# Patient Record
Sex: Male | Born: 1935 | Race: White | Hispanic: No | Smoking: Former smoker
Health system: Southern US, Community
[De-identification: ages and names within clinical notes are randomized; demographics above are authoritative.]

## PROBLEM LIST (undated history)

## (undated) DIAGNOSIS — E785 Hyperlipidemia, unspecified: Secondary | ICD-10-CM

## (undated) DIAGNOSIS — E119 Type 2 diabetes mellitus without complications: Secondary | ICD-10-CM

## (undated) DIAGNOSIS — R011 Cardiac murmur, unspecified: Secondary | ICD-10-CM

## (undated) DIAGNOSIS — I10 Essential (primary) hypertension: Secondary | ICD-10-CM

## (undated) DIAGNOSIS — IMO0001 Reserved for inherently not codable concepts without codable children: Secondary | ICD-10-CM

## (undated) DIAGNOSIS — M1990 Unspecified osteoarthritis, unspecified site: Secondary | ICD-10-CM

## (undated) DIAGNOSIS — H409 Unspecified glaucoma: Secondary | ICD-10-CM

## (undated) DIAGNOSIS — I4891 Unspecified atrial fibrillation: Secondary | ICD-10-CM

## (undated) DIAGNOSIS — C801 Malignant (primary) neoplasm, unspecified: Secondary | ICD-10-CM

## (undated) HISTORY — PX: TONSILLECTOMY: SUR1361

## (undated) HISTORY — PX: CHOLECYSTECTOMY: SHX55

## (undated) HISTORY — PX: EYE SURGERY: SHX253

## (undated) HISTORY — PX: CARDIAC CATHETERIZATION: SHX172

---

## 2004-10-15 ENCOUNTER — Ambulatory Visit: Payer: Self-pay | Admitting: Internal Medicine

## 2004-11-04 ENCOUNTER — Ambulatory Visit: Payer: Self-pay | Admitting: Internal Medicine

## 2004-12-05 ENCOUNTER — Ambulatory Visit: Payer: Self-pay | Admitting: Internal Medicine

## 2005-01-05 ENCOUNTER — Ambulatory Visit: Payer: Self-pay | Admitting: Internal Medicine

## 2008-12-05 HISTORY — PX: AORTIC VALVE REPLACEMENT: SHX41

## 2009-01-27 ENCOUNTER — Ambulatory Visit: Payer: Self-pay | Admitting: Family Medicine

## 2009-01-29 ENCOUNTER — Ambulatory Visit: Payer: Self-pay | Admitting: Family Medicine

## 2009-03-04 ENCOUNTER — Encounter: Payer: Self-pay | Admitting: Cardiovascular Disease

## 2009-03-05 ENCOUNTER — Encounter: Payer: Self-pay | Admitting: Cardiovascular Disease

## 2009-04-04 ENCOUNTER — Encounter: Payer: Self-pay | Admitting: Cardiovascular Disease

## 2009-12-29 ENCOUNTER — Ambulatory Visit: Payer: Self-pay | Admitting: Unknown Physician Specialty

## 2010-01-05 ENCOUNTER — Ambulatory Visit: Payer: Self-pay | Admitting: Unknown Physician Specialty

## 2010-11-11 ENCOUNTER — Ambulatory Visit: Payer: Self-pay | Admitting: Ophthalmology

## 2010-11-15 ENCOUNTER — Ambulatory Visit: Payer: Self-pay | Admitting: Ophthalmology

## 2011-11-13 ENCOUNTER — Inpatient Hospital Stay: Payer: Self-pay | Admitting: Internal Medicine

## 2012-10-04 ENCOUNTER — Ambulatory Visit: Payer: Self-pay | Admitting: Ophthalmology

## 2012-10-04 LAB — POTASSIUM: Potassium: 4.3 mmol/L (ref 3.5–5.1)

## 2012-10-15 ENCOUNTER — Ambulatory Visit: Payer: Self-pay | Admitting: Ophthalmology

## 2013-03-28 ENCOUNTER — Ambulatory Visit: Payer: Self-pay | Admitting: Cardiology

## 2015-03-24 NOTE — Op Note (Signed)
PATIENT NAME:  Steven Phelps, Steven Phelps MR#:  [id number] DATE OF BIRTH:  03-05-1931  DATE OF PROCEDURE:  10/15/2012  PREOPERATIVE DIAGNOSIS: Cataract, right eye.   POSTOPERATIVE DIAGNOSIS: Cataract, right eye.   PROCEDURE PERFORMED: Extracapsular cataract extraction using phacoemulsification with placement of an Alcon [id number], 22-diopter posterior chamber lens, serial # [id number].   SURGEON: Loura Back. [place] Sloop, M.D.   ANESTHESIA: 4% lidocaine and 0.75% Marcaine in a 50-50 mixture with 10 units/mL of Hylenex added, given as a peribulbar.   ANESTHESIOLOGIST: Dr. Benjamine Mola   COMPLICATIONS: None.   ESTIMATED BLOOD LOSS: Less than 1 mL.   DESCRIPTION OF PROCEDURE:  The patient was brought to the operating room and given a peribulbar block.  The patient was then prepped and draped in the usual fashion.  The vertical rectus muscles were imbricated using 5-0 silk sutures.  These sutures were then clamped to the sterile drapes as bridle sutures.  A limbal peritomy was performed extending two clock hours and hemostasis was obtained with cautery.  A partial thickness scleral groove was made at the surgical limbus and then dissected anteriorly in a lamellar dissection with using an Alcon crescent knife.  The anterior chamber was entered superonasally with a Superblade and through the lamellar dissection with a 2.6-mm keratome.  DisCoVisc was used to replace the aqueous and a continuous tear capsulorrhexis was carried out.  Hydrodissection and hydrodelineation were carried out with balanced salt and a 27 gauge canula.  The nucleus was rotated to confirm the effectiveness of the hydrodissection.  Phacoemulsification was carried out using a divide-and-conquer technique.  Total ultrasound time was 2 minutes and 15 seconds with an average power of 22.9 percent.  CDE 53.17.  Irrigation/aspiration was used to remove the residual cortex.  DisCoVisc was used to inflate the capsule and the internal wound was enlarged to 3  mm with the crescent knife.  The intraocular lens was inserted into the capsular bag using the Acrysert delivery system.  Irrigation/aspiration was used to remove the residual DisCoVisc.  Miostat was injected into the anterior chamber through the paracentesis track to inflate the anterior chamber and induce miosis.  The wound was checked for leaks and wound leakage was found.  A single 10-0 suture was placed across the incision, tied and the knot was rotated superiorly.  The conjunctiva was closed with cautery and the bridle sutures were removed.  Two drops of 0.3% Vigamox were placed on the eye.  An eye shield was placed on the eye.  The patient was discharged to the recovery room in good condition.   ____________________________ Loura Back Jamilyn Pigeon, MD sad:bjt D: 10/15/2012 13:18:32 ET T: 10/15/2012 13:31:08 ET JOB#: [id number]  cc: Remo Lipps A. Kessler Kopinski, MD, <Dictator> Martie Lee MD ELECTRONICALLY SIGNED 10/29/2012 14:39

## 2015-05-20 ENCOUNTER — Encounter
Admission: RE | Admit: 2015-05-20 | Discharge: 2015-05-20 | Disposition: A | Discharge status: Home / Self Care | Payer: Medicare Other | Source: Ambulatory Visit | Attending: Orthopedic Surgery | Admitting: Orthopedic Surgery

## 2015-05-20 DIAGNOSIS — Z01812 Encounter for preprocedural laboratory examination: Secondary | ICD-10-CM | POA: Insufficient documentation

## 2015-05-20 DIAGNOSIS — Z01810 Encounter for preprocedural cardiovascular examination: Secondary | ICD-10-CM | POA: Insufficient documentation

## 2015-05-20 DIAGNOSIS — Z952 Presence of prosthetic heart valve: Secondary | ICD-10-CM | POA: Insufficient documentation

## 2015-05-20 HISTORY — DX: Malignant (primary) neoplasm, unspecified: C80.1

## 2015-05-20 HISTORY — DX: Type 2 diabetes mellitus without complications: E11.9

## 2015-05-20 HISTORY — DX: Unspecified osteoarthritis, unspecified site: M19.90

## 2015-05-20 HISTORY — DX: Cardiac murmur, unspecified: R01.1

## 2015-05-20 HISTORY — DX: Essential (primary) hypertension: I10

## 2015-05-20 HISTORY — DX: Reserved for inherently not codable concepts without codable children: IMO0001

## 2015-05-20 HISTORY — DX: Unspecified glaucoma: H40.9

## 2015-05-20 LAB — HEMOGLOBIN A1C: HEMOGLOBIN A1C: 7.7 % — AB (ref 4.0–6.0)

## 2015-05-20 LAB — PROTIME-INR
INR: 0.96
PROTHROMBIN TIME: 13 s (ref 11.4–15.0)

## 2015-05-20 LAB — SEDIMENTATION RATE: Sed Rate: 43 mm/hr — ABNORMAL HIGH (ref 0–20)

## 2015-05-20 LAB — BASIC METABOLIC PANEL
ANION GAP: 5 (ref 5–15)
BUN: 23 mg/dL — AB (ref 6–20)
CHLORIDE: 105 mmol/L (ref 101–111)
CO2: 29 mmol/L (ref 22–32)
CREATININE: 1.09 mg/dL (ref 0.61–1.24)
Calcium: 9.6 mg/dL (ref 8.9–10.3)
GFR calc Af Amer: >60 mL/min (ref >60)
GLUCOSE: 163 mg/dL — AB (ref 65–99)
POTASSIUM: 4.5 mmol/L (ref 3.5–5.1)
Sodium: 139 mmol/L (ref 135–145)

## 2015-05-20 LAB — CBC
HCT: 40 % (ref 40.0–52.0)
Hemoglobin: 13.1 g/dL (ref 13.0–18.0)
MCH: 31.5 pg (ref 26.0–34.0)
MCHC: 32.7 g/dL (ref 32.0–36.0)
MCV: 96.3 fL (ref 80.0–100.0)
PLATELETS: 147 10*3/uL — AB (ref 150–440)
RBC: 4.15 MIL/uL — ABNORMAL LOW (ref 4.40–5.90)
RDW: 14.8 % — ABNORMAL HIGH (ref 11.5–14.5)
WBC: 4.9 10*3/uL (ref 3.8–10.6)

## 2015-05-20 LAB — URINALYSIS COMPLETE WITH MICROSCOPIC (ARMC ONLY)
Bacteria, UA: NONE SEEN
Bilirubin Urine: NEGATIVE
GLUCOSE, UA: NEGATIVE mg/dL
Hgb urine dipstick: NEGATIVE
Ketones, ur: NEGATIVE mg/dL
LEUKOCYTES UA: NEGATIVE
NITRITE: NEGATIVE
Protein, ur: NEGATIVE mg/dL
RBC / HPF: NONE SEEN RBC/hpf (ref 0–5)
Specific Gravity, Urine: 1.011 (ref 1.005–1.030)
Squamous Epithelial / LPF: NONE SEEN
pH: 5 (ref 5.0–8.0)

## 2015-05-20 LAB — SURGICAL PCR SCREEN
MRSA, PCR: NEGATIVE
Staphylococcus aureus: NEGATIVE

## 2015-05-20 LAB — APTT: APTT: 26 s (ref 24–36)

## 2015-05-20 LAB — ABO/RH: ABO/RH(D): A POS

## 2015-05-20 LAB — TYPE AND SCREEN
ABO/RH(D): A POS
ANTIBODY SCREEN: NEGATIVE

## 2015-05-20 NOTE — Patient Instructions (Signed)
  Your procedure is scheduled on: June 03, 2015 (Wednesday) Report to Same Day Surgery. To find out your arrival time please call [phone] between 1PM - 3PM on June 02, 2015 (Tuesday).  Remember: Instructions that are not followed completely may result in serious medical risk, up to and including death, or upon the discretion of your surgeon and anesthesiologist your surgery may need to be rescheduled.    __x__ 1. Do not eat food or drink liquids after midnight. No gum chewing or hard candies.     __x__ 2. No Alcohol for 24 hours before or after surgery.   ____ 3. Bring all medications with you on the day of surgery if instructed.    _x_ 4. Notify your doctor if there is any change in your medical condition     (cold, fever, infections).     Do not wear jewelry, make-up, hairpins, clips or nail polish.  Do not wear lotions, powders, or perfumes. You may wear deodorant.  Do not shave 48 hours prior to surgery. Men may shave face and neck.  Do not bring valuables to the hospital.    [facility] is not responsible for any belongings or valuables.               Contacts, dentures or bridgework may not be worn into surgery.  Leave your suitcase in the car. After surgery it may be brought to your room.  For patients admitted to the hospital, discharge time is determined by your                treatment team.   Patients discharged the day of surgery will not be allowed to drive home.   Please read over the following fact sheets that you were given:   MRSA Information and Surgical Site Infection Prevention   __x_ Take these medicines the morning of surgery with A SIP OF WATER:    1. Losartan  2. Metoprolol  3.   4.  5.  6.  ____ Fleet Enema (as directed)   _x_ Use CHG Soap as directed  ____ Use inhalers on the day of surgery (Proair inhaler)  ____ Stop metformin 2 days prior to surgery    __x__ Take 1/2 of usual insulin dose the night before surgery and none on the  morning of surgery. (take 1/2 of  Lantus insulin on Tuesday night) __x_ Stop Coumadin/Plavix/aspirin on (stop aspirin one week before surgery per Kinnie Scales per patient) __x_ Stop Anti-inflammatories on (one week before surgery)   __x__ Stop supplements until after surgery.  (now)  ____ Bring C-Pap to the hospital.

## 2015-05-22 LAB — URINE CULTURE: Culture: NO GROWTH

## 2015-06-03 ENCOUNTER — Encounter: Payer: Self-pay | Admitting: Orthopedic Surgery

## 2015-06-03 ENCOUNTER — Inpatient Hospital Stay: Payer: Medicare Other | Admitting: Anesthesiology

## 2015-06-03 ENCOUNTER — Inpatient Hospital Stay
Admission: RE | Admit: 2015-06-03 | Discharge: 2015-06-06 | DRG: 470 | Disposition: A | Discharge status: Skilled Nursing Facility | Payer: Medicare Other | Source: Ambulatory Visit | Attending: Orthopedic Surgery | Admitting: Orthopedic Surgery

## 2015-06-03 ENCOUNTER — Inpatient Hospital Stay: Payer: Medicare Other

## 2015-06-03 ENCOUNTER — Encounter
Admission: RE | Disposition: A | Discharge status: Skilled Nursing Facility | Payer: Self-pay | Source: Ambulatory Visit | Attending: Orthopedic Surgery

## 2015-06-03 DIAGNOSIS — M1712 Unilateral primary osteoarthritis, left knee: Secondary | ICD-10-CM | POA: Diagnosis present

## 2015-06-03 DIAGNOSIS — Z85828 Personal history of other malignant neoplasm of skin: Secondary | ICD-10-CM | POA: Diagnosis not present

## 2015-06-03 DIAGNOSIS — Z96659 Presence of unspecified artificial knee joint: Secondary | ICD-10-CM

## 2015-06-03 DIAGNOSIS — Z794 Long term (current) use of insulin: Secondary | ICD-10-CM | POA: Diagnosis not present

## 2015-06-03 DIAGNOSIS — I10 Essential (primary) hypertension: Secondary | ICD-10-CM | POA: Diagnosis present

## 2015-06-03 DIAGNOSIS — E109 Type 1 diabetes mellitus without complications: Secondary | ICD-10-CM | POA: Diagnosis present

## 2015-06-03 DIAGNOSIS — Z87891 Personal history of nicotine dependence: Secondary | ICD-10-CM | POA: Diagnosis not present

## 2015-06-03 DIAGNOSIS — H409 Unspecified glaucoma: Secondary | ICD-10-CM | POA: Diagnosis present

## 2015-06-03 DIAGNOSIS — Z6834 Body mass index (BMI) 34.0-34.9, adult: Secondary | ICD-10-CM

## 2015-06-03 HISTORY — PX: TOTAL KNEE ARTHROPLASTY: SHX125

## 2015-06-03 LAB — GLUCOSE, CAPILLARY
GLUCOSE-CAPILLARY: 171 mg/dL — AB (ref 65–99)
Glucose-Capillary: 125 mg/dL — ABNORMAL HIGH (ref 65–99)
Glucose-Capillary: 143 mg/dL — ABNORMAL HIGH (ref 65–99)
Glucose-Capillary: 133 mg/dL — ABNORMAL HIGH (ref 65–99)

## 2015-06-03 SURGERY — ARTHROPLASTY, KNEE, TOTAL
Anesthesia: Spinal | Site: Knee | Laterality: Left | Wound class: Clean

## 2015-06-03 MED ORDER — CEFAZOLIN SODIUM-DEXTROSE 2-3 GM-% IV SOLR: 2.0000 g | Freq: Once | INTRAVENOUS | Status: DC

## 2015-06-03 MED ORDER — METRONIDAZOLE 0.75 % EX GEL: 1.0000 "application " | Freq: Every day | CUTANEOUS | Status: DC

## 2015-06-03 MED ORDER — FENTANYL CITRATE (PF) 100 MCG/2ML IJ SOLN: 25.0000 ug | INTRAMUSCULAR | Status: DC | PRN

## 2015-06-03 MED ORDER — BUPIVACAINE HCL (PF) 0.75 % IJ SOLN
INTRAMUSCULAR | Status: DC | PRN
  Administered 2015-06-03: 10 mL via INTRATHECAL

## 2015-06-03 MED ORDER — EPHEDRINE SULFATE 50 MG/ML IJ SOLN
INTRAMUSCULAR | Status: DC | PRN
  Administered 2015-06-03: 10 mg via INTRAVENOUS

## 2015-06-03 MED ORDER — GLIPIZIDE 5 MG PO TABS
10.0000 mg | ORAL_TABLET | Freq: Two times a day (BID) | ORAL | Status: DC
  Administered 2015-06-03 – 2015-06-06 (×6): 10 mg via ORAL
  Filled 2015-06-03 (×6): qty 2

## 2015-06-03 MED ORDER — PANTOPRAZOLE SODIUM 40 MG PO TBEC
40.0000 mg | DELAYED_RELEASE_TABLET | Freq: Two times a day (BID) | ORAL | Status: DC
  Administered 2015-06-03 – 2015-06-06 (×6): 40 mg via ORAL
  Filled 2015-06-03 (×6): qty 1

## 2015-06-03 MED ORDER — SODIUM CHLORIDE 0.9 % IV SOLN
INTRAVENOUS | Status: DC
  Administered 2015-06-03 – 2015-06-04 (×2): via INTRAVENOUS

## 2015-06-03 MED ORDER — BUPIVACAINE-EPINEPHRINE (PF) 0.25% -1:200000 IJ SOLN
INTRAMUSCULAR | Status: AC
  Filled 2015-06-03: qty 30

## 2015-06-03 MED ORDER — MAGNESIUM HYDROXIDE 400 MG/5ML PO SUSP
30.0000 mL | Freq: Every day | ORAL | Status: DC | PRN
  Administered 2015-06-05: 30 mL via ORAL
  Filled 2015-06-03 (×2): qty 30

## 2015-06-03 MED ORDER — FERROUS SULFATE 325 (65 FE) MG PO TABS
325.0000 mg | ORAL_TABLET | Freq: Two times a day (BID) | ORAL | Status: DC
  Administered 2015-06-03 – 2015-06-06 (×6): 325 mg via ORAL
  Filled 2015-06-03 (×6): qty 1

## 2015-06-03 MED ORDER — ACETAMINOPHEN 10 MG/ML IV SOLN
INTRAVENOUS | Status: AC
  Filled 2015-06-03: qty 100

## 2015-06-03 MED ORDER — FLUTICASONE PROPIONATE 50 MCG/ACT NA SUSP
1.0000 | Freq: Every day | NASAL | Status: DC
Start: 2015-06-03 — End: 2015-06-06
  Filled 2015-06-03: qty 16

## 2015-06-03 MED ORDER — ADULT MULTIVITAMIN W/MINERALS CH
1.0000 | ORAL_TABLET | Freq: Every day | ORAL | Status: DC
  Administered 2015-06-05 – 2015-06-06 (×2): 1 via ORAL
  Filled 2015-06-03 (×2): qty 1

## 2015-06-03 MED ORDER — NEOMYCIN-POLYMYXIN B GU 40-200000 IR SOLN
Status: DC | PRN
  Administered 2015-06-03: 14 mL

## 2015-06-03 MED ORDER — CEFAZOLIN SODIUM-DEXTROSE 2-3 GM-% IV SOLR
INTRAVENOUS | Status: DC | PRN
  Administered 2015-06-03: 2 g via INTRAVENOUS

## 2015-06-03 MED ORDER — SODIUM CHLORIDE 0.9 % IV SOLN
INTRAVENOUS | Status: DC
  Administered 2015-06-03 (×3): via INTRAVENOUS

## 2015-06-03 MED ORDER — INSULIN GLARGINE 100 UNIT/ML ~~LOC~~ SOLN
25.0000 [IU] | Freq: Every day | SUBCUTANEOUS | Status: DC
  Administered 2015-06-03 – 2015-06-05 (×3): 25 [IU] via SUBCUTANEOUS
  Filled 2015-06-03 (×4): qty 0.25

## 2015-06-03 MED ORDER — INSULIN ASPART 100 UNIT/ML ~~LOC~~ SOLN
0.0000 [IU] | Freq: Three times a day (TID) | SUBCUTANEOUS | Status: DC
  Administered 2015-06-03 – 2015-06-05 (×4): 2 [IU] via SUBCUTANEOUS
  Administered 2015-06-05: 3 [IU] via SUBCUTANEOUS
  Administered 2015-06-06: 2 [IU] via SUBCUTANEOUS
  Filled 2015-06-03 (×2): qty 2
  Filled 2015-06-03: qty 3
  Filled 2015-06-03 (×3): qty 2

## 2015-06-03 MED ORDER — ENOXAPARIN SODIUM 30 MG/0.3ML ~~LOC~~ SOLN
30.0000 mg | Freq: Two times a day (BID) | SUBCUTANEOUS | Status: DC
  Administered 2015-06-04 – 2015-06-06 (×5): 30 mg via SUBCUTANEOUS
  Filled 2015-06-03 (×5): qty 0.3

## 2015-06-03 MED ORDER — ACETAMINOPHEN 10 MG/ML IV SOLN
INTRAVENOUS | Status: DC | PRN
  Administered 2015-06-03: 1000 mg via INTRAVENOUS

## 2015-06-03 MED ORDER — AMLODIPINE BESYLATE 5 MG PO TABS
5.0000 mg | ORAL_TABLET | Freq: Every day | ORAL | Status: DC
  Administered 2015-06-03 – 2015-06-05 (×3): 5 mg via ORAL
  Filled 2015-06-03 (×3): qty 1

## 2015-06-03 MED ORDER — SENNOSIDES-DOCUSATE SODIUM 8.6-50 MG PO TABS
1.0000 | ORAL_TABLET | Freq: Two times a day (BID) | ORAL | Status: DC
  Administered 2015-06-03 – 2015-06-06 (×6): 1 via ORAL
  Filled 2015-06-03 (×6): qty 1

## 2015-06-03 MED ORDER — ONDANSETRON HCL 4 MG/2ML IJ SOLN: 4.0000 mg | Freq: Four times a day (QID) | INTRAMUSCULAR | Status: DC | PRN

## 2015-06-03 MED ORDER — ACETAMINOPHEN 10 MG/ML IV SOLN
1000.0000 mg | Freq: Four times a day (QID) | INTRAVENOUS | Status: AC
  Administered 2015-06-03 – 2015-06-04 (×3): 1000 mg via INTRAVENOUS
  Filled 2015-06-03 (×4): qty 100

## 2015-06-03 MED ORDER — TETRACAINE HCL 1 % IJ SOLN
INTRAMUSCULAR | Status: DC | PRN
  Administered 2015-06-03: 5 mg via INTRASPINAL

## 2015-06-03 MED ORDER — CEFAZOLIN SODIUM-DEXTROSE 2-3 GM-% IV SOLR
INTRAVENOUS | Status: AC
  Filled 2015-06-03: qty 50

## 2015-06-03 MED ORDER — ONDANSETRON HCL 4 MG/2ML IJ SOLN: 4.0000 mg | Freq: Once | INTRAMUSCULAR | Status: DC | PRN

## 2015-06-03 MED ORDER — SODIUM CHLORIDE 0.9 % IV SOLN: Freq: Once | INTRAVENOUS | Status: DC

## 2015-06-03 MED ORDER — FLEET ENEMA 7-19 GM/118ML RE ENEM: 1.0000 | ENEMA | Freq: Once | RECTAL | Status: AC | PRN

## 2015-06-03 MED ORDER — ALBUTEROL SULFATE HFA 108 (90 BASE) MCG/ACT IN AERS: 2.0000 | INHALATION_SPRAY | Freq: Four times a day (QID) | RESPIRATORY_TRACT | Status: DC | PRN

## 2015-06-03 MED ORDER — VITAMIN C 500 MG PO TABS
500.0000 mg | ORAL_TABLET | Freq: Every day | ORAL | Status: DC
  Administered 2015-06-05 – 2015-06-06 (×2): 500 mg via ORAL
  Filled 2015-06-03 (×2): qty 1

## 2015-06-03 MED ORDER — PROPOFOL INFUSION 10 MG/ML OPTIME
INTRAVENOUS | Status: DC | PRN
  Administered 2015-06-03: 75 ug/kg/min via INTRAVENOUS

## 2015-06-03 MED ORDER — NEOMYCIN-POLYMYXIN B GU 40-200000 IR SOLN
Status: AC
  Filled 2015-06-03: qty 20

## 2015-06-03 MED ORDER — METOPROLOL TARTRATE 100 MG PO TABS
100.0000 mg | ORAL_TABLET | Freq: Two times a day (BID) | ORAL | Status: DC
  Administered 2015-06-03 – 2015-06-06 (×4): 100 mg via ORAL
  Filled 2015-06-03 (×6): qty 1

## 2015-06-03 MED ORDER — MIDAZOLAM HCL 5 MG/5ML IJ SOLN
INTRAMUSCULAR | Status: DC | PRN
  Administered 2015-06-03: 2 mg via INTRAVENOUS

## 2015-06-03 MED ORDER — TRAMADOL HCL 50 MG PO TABS
50.0000 mg | ORAL_TABLET | ORAL | Status: DC | PRN
  Administered 2015-06-03 – 2015-06-06 (×6): 50 mg via ORAL
  Filled 2015-06-03 (×6): qty 1

## 2015-06-03 MED ORDER — ACETAMINOPHEN 325 MG PO TABS: 650.0000 mg | ORAL_TABLET | Freq: Four times a day (QID) | ORAL | Status: DC | PRN

## 2015-06-03 MED ORDER — LATANOPROST 0.005 % OP SOLN
1.0000 [drp] | Freq: Every day | OPHTHALMIC | Status: DC
  Administered 2015-06-03 – 2015-06-05 (×3): 1 [drp] via OPHTHALMIC
  Filled 2015-06-03: qty 2.5

## 2015-06-03 MED ORDER — SODIUM CHLORIDE 0.9 % IJ SOLN
INTRAMUSCULAR | Status: AC
  Filled 2015-06-03: qty 50

## 2015-06-03 MED ORDER — ONDANSETRON HCL 4 MG PO TABS: 4.0000 mg | ORAL_TABLET | Freq: Four times a day (QID) | ORAL | Status: DC | PRN

## 2015-06-03 MED ORDER — PHENOL 1.4 % MT LIQD: 1.0000 | OROMUCOSAL | Status: DC | PRN

## 2015-06-03 MED ORDER — MENTHOL 3 MG MT LOZG
1.0000 | LOZENGE | OROMUCOSAL | Status: DC | PRN
Start: 2015-06-03 — End: 2015-06-06

## 2015-06-03 MED ORDER — SODIUM CHLORIDE 0.9 % IV SOLN
Freq: Once | INTRAVENOUS | Status: AC
  Administered 2015-06-03: 15:00:00 via INTRAVENOUS

## 2015-06-03 MED ORDER — FOLIC ACID 1 MG PO TABS
1000.0000 ug | ORAL_TABLET | Freq: Every day | ORAL | Status: DC
  Administered 2015-06-05 – 2015-06-06 (×2): 1 mg via ORAL
  Filled 2015-06-03 (×2): qty 1

## 2015-06-03 MED ORDER — MORPHINE SULFATE 2 MG/ML IJ SOLN: 2.0000 mg | INTRAMUSCULAR | Status: DC | PRN

## 2015-06-03 MED ORDER — ONDANSETRON HCL 4 MG/2ML IJ SOLN: 4.0000 mg | Freq: Once | INTRAMUSCULAR | Status: AC | PRN

## 2015-06-03 MED ORDER — OXYCODONE HCL 5 MG PO TABS
5.0000 mg | ORAL_TABLET | ORAL | Status: DC | PRN
  Administered 2015-06-03 – 2015-06-05 (×5): 5 mg via ORAL
  Administered 2015-06-05 (×2): 10 mg via ORAL
  Filled 2015-06-03 (×5): qty 1
  Filled 2015-06-03 (×2): qty 2

## 2015-06-03 MED ORDER — ALUM & MAG HYDROXIDE-SIMETH 200-200-20 MG/5ML PO SUSP: 30.0000 mL | ORAL | Status: DC | PRN

## 2015-06-03 MED ORDER — ALBUTEROL SULFATE (2.5 MG/3ML) 0.083% IN NEBU
2.5000 mg | INHALATION_SOLUTION | Freq: Four times a day (QID) | RESPIRATORY_TRACT | Status: DC | PRN
  Administered 2015-06-05 (×2): 2.5 mg via RESPIRATORY_TRACT
  Filled 2015-06-03 (×3): qty 3

## 2015-06-03 MED ORDER — ACETAMINOPHEN 650 MG RE SUPP: 650.0000 mg | Freq: Four times a day (QID) | RECTAL | Status: DC | PRN

## 2015-06-03 MED ORDER — VITAMIN E 180 MG (400 UNIT) PO CAPS
400.0000 [IU] | ORAL_CAPSULE | Freq: Every day | ORAL | Status: DC
  Administered 2015-06-05 – 2015-06-06 (×2): 400 [IU] via ORAL
  Filled 2015-06-03 (×2): qty 1

## 2015-06-03 MED ORDER — SODIUM CHLORIDE 0.9 % IV SOLN
10000.0000 ug | INTRAVENOUS | Status: DC | PRN
  Administered 2015-06-03 (×2): 50 ug via INTRAVENOUS

## 2015-06-03 MED ORDER — SODIUM CHLORIDE 0.9 % IV SOLN
INTRAVENOUS | Status: DC | PRN
  Administered 2015-06-03: 60 mL

## 2015-06-03 MED ORDER — METOCLOPRAMIDE HCL 10 MG PO TABS
10.0000 mg | ORAL_TABLET | Freq: Three times a day (TID) | ORAL | Status: AC
  Administered 2015-06-03 – 2015-06-05 (×8): 10 mg via ORAL
  Filled 2015-06-03 (×8): qty 1

## 2015-06-03 MED ORDER — ATORVASTATIN CALCIUM 20 MG PO TABS
40.0000 mg | ORAL_TABLET | Freq: Every day | ORAL | Status: DC
  Administered 2015-06-03 – 2015-06-05 (×3): 40 mg via ORAL
  Filled 2015-06-03 (×3): qty 2

## 2015-06-03 MED ORDER — BUPIVACAINE-EPINEPHRINE 0.25% -1:200000 IJ SOLN
INTRAMUSCULAR | Status: DC | PRN
  Administered 2015-06-03: 30 mL

## 2015-06-03 MED ORDER — BISACODYL 10 MG RE SUPP
10.0000 mg | Freq: Every day | RECTAL | Status: DC | PRN
  Administered 2015-06-06: 10 mg via RECTAL
  Filled 2015-06-03: qty 1

## 2015-06-03 MED ORDER — FUROSEMIDE 20 MG PO TABS
20.0000 mg | ORAL_TABLET | Freq: Every day | ORAL | Status: DC
  Administered 2015-06-04 – 2015-06-06 (×2): 20 mg via ORAL
  Filled 2015-06-03 (×4): qty 1

## 2015-06-03 MED ORDER — BUPIVACAINE LIPOSOME 1.3 % IJ SUSP
INTRAMUSCULAR | Status: AC
  Filled 2015-06-03: qty 20

## 2015-06-03 MED ORDER — LOSARTAN POTASSIUM 50 MG PO TABS
50.0000 mg | ORAL_TABLET | Freq: Two times a day (BID) | ORAL | Status: DC
  Administered 2015-06-03 – 2015-06-06 (×5): 50 mg via ORAL
  Filled 2015-06-03 (×6): qty 1

## 2015-06-03 MED ORDER — ONDANSETRON HCL 4 MG/2ML IJ SOLN
INTRAMUSCULAR | Status: DC | PRN
  Administered 2015-06-03: 4 mg via INTRAVENOUS

## 2015-06-03 MED ORDER — CEFAZOLIN SODIUM-DEXTROSE 2-3 GM-% IV SOLR
2.0000 g | Freq: Four times a day (QID) | INTRAVENOUS | Status: AC
  Administered 2015-06-03 – 2015-06-04 (×4): 2 g via INTRAVENOUS
  Filled 2015-06-03 (×4): qty 50

## 2015-06-03 SURGICAL SUPPLY — 57 items
AUTOTRANSFUS HAS 1/8 (MISCELLANEOUS) ×3
BATTERY INSTRU NAVIGATION (MISCELLANEOUS) ×12 IMPLANT
BLADE SAW 1 (BLADE) ×3 IMPLANT
BLADE SAW 1/2 (BLADE) ×3 IMPLANT
BONE CEMENT GENTAMICIN (Cement) ×6 IMPLANT
CANISTER SUCT 1200ML W/VALVE (MISCELLANEOUS) ×3 IMPLANT
CANISTER SUCT 3000ML (MISCELLANEOUS) ×6 IMPLANT
CAP KNEE TOTAL 3 SIGMA ×3 IMPLANT
CATH TRAY 16F METER LATEX (MISCELLANEOUS) ×3 IMPLANT
CEMENT BONE GENTAMICIN 40 (Cement) ×2 IMPLANT
COOLER POLAR GLACIER W/PUMP (MISCELLANEOUS) ×3 IMPLANT
DRAPE INCISE IOBAN 66X45 STRL (DRAPES) ×3 IMPLANT
DRAPE SHEET LG 3/4 BI-LAMINATE (DRAPES) ×3 IMPLANT
DRSG DERMACEA 8X12 NADH (GAUZE/BANDAGES/DRESSINGS) ×3 IMPLANT
DRSG OPSITE POSTOP 4X14 (GAUZE/BANDAGES/DRESSINGS) ×3 IMPLANT
DURAPREP 26ML APPLICATOR (WOUND CARE) ×3 IMPLANT
ELECT CAUTERY BLADE 6.4 (BLADE) ×3 IMPLANT
EX-PIN ORTHOLOCK NAV 4X150 (PIN) ×6 IMPLANT
GLOVE BIOGEL M STRL SZ7.5 (GLOVE) ×6 IMPLANT
GLOVE INDICATOR 8.0 STRL GRN (GLOVE) ×3 IMPLANT
GLOVE SURG 9.0 ORTHO LTXF (GLOVE) ×3 IMPLANT
GLOVE SURG ORTHO 9.0 STRL STRW (GLOVE) ×3 IMPLANT
GOWN STRL REUS W/ TWL LRG LVL3 (GOWN DISPOSABLE) ×1 IMPLANT
GOWN STRL REUS W/TWL 2XL LVL3 (GOWN DISPOSABLE) ×3 IMPLANT
GOWN STRL REUS W/TWL LRG LVL3 (GOWN DISPOSABLE) ×2
GOWN STRL REUS W/TWL XL LVL4 (GOWN DISPOSABLE) ×3 IMPLANT
HANDPIECE SUCTION TUBG SURGILV (MISCELLANEOUS) ×3 IMPLANT
HOLDER FOLEY CATH W/STRAP (MISCELLANEOUS) ×3 IMPLANT
HOOD PEEL AWAY FACE SHEILD DIS (HOOD) ×6 IMPLANT
KNIFE SCULPS 14X20 (INSTRUMENTS) ×3 IMPLANT
NDL SAFETY 18GX1.5 (NEEDLE) ×3 IMPLANT
NEEDLE SPNL 18GX3.5 QUINCKE PK (NEEDLE) IMPLANT
NEEDLE SPNL 20GX3.5 QUINCKE YW (NEEDLE) ×3 IMPLANT
NS IRRIG 500ML POUR BTL (IV SOLUTION) ×3 IMPLANT
PACK TOTAL KNEE (MISCELLANEOUS) ×3 IMPLANT
PAD GROUND ADULT SPLIT (MISCELLANEOUS) ×3 IMPLANT
PAD WRAPON POLAR KNEE (MISCELLANEOUS) ×1 IMPLANT
PIN FIXATION 1/8DIA X 3INL (PIN) ×3 IMPLANT
SOL .9 NS 3000ML IRR  AL (IV SOLUTION) ×2
SOL .9 NS 3000ML IRR UROMATIC (IV SOLUTION) ×1 IMPLANT
SOL PREP PVP 2OZ (MISCELLANEOUS) ×3
SOLUTION PREP PVP 2OZ (MISCELLANEOUS) ×1 IMPLANT
SPONGE DRAIN TRACH 4X4 STRL 2S (GAUZE/BANDAGES/DRESSINGS) ×3 IMPLANT
STAPLER SKIN PROX 35W (STAPLE) ×3 IMPLANT
STRAP SAFETY BODY (MISCELLANEOUS) ×3 IMPLANT
SUCTION FRAZIER TIP 10 FR DISP (SUCTIONS) ×3 IMPLANT
SUT VIC AB 0 CT1 36 (SUTURE) ×3 IMPLANT
SUT VIC AB 1 CT1 36 (SUTURE) ×6 IMPLANT
SUT VIC AB 2-0 CT2 27 (SUTURE) ×3 IMPLANT
SYR 20CC LL (SYRINGE) ×3 IMPLANT
SYR 30ML LL (SYRINGE) ×3 IMPLANT
SYR 50ML LL SCALE MARK (SYRINGE) ×3 IMPLANT
SYSTEM AUTOTRANSFUS DUAL TROCR (MISCELLANEOUS) ×1 IMPLANT
TOWEL OR 17X26 4PK STRL BLUE (TOWEL DISPOSABLE) ×3 IMPLANT
TOWER CARTRIDGE SMART MIX (DISPOSABLE) ×3 IMPLANT
WATER STERILE IRR 1000ML POUR (IV SOLUTION) IMPLANT
WRAPON POLAR PAD KNEE (MISCELLANEOUS) ×3

## 2015-06-03 NOTE — Transfer of Care (Signed)
Immediate Anesthesia Transfer of Care Note  Patient: Steven Phelps  Procedure(s) Performed: Procedure(s): TOTAL KNEE ARTHROPLASTY (Left)  Patient Location: PACU  Anesthesia Type:Spinal  Level of Consciousness: sedated  Airway & Oxygen Therapy: Patient connected to face mask oxygen  Post-op Assessment: Report given to RN  Post vital signs: Reviewed and stable  Last Vitals:  Filed Vitals:   06/03/15 1040  BP:   Temp: 36.6 C  Resp:     Complications: No apparent anesthesia complications

## 2015-06-03 NOTE — Anesthesia Procedure Notes (Signed)
Spinal Patient location during procedure: OR Start time: 06/03/2015 7:10 AM End time: 06/03/2015 7:15 AM Reason for block: at surgeon's request Staffing Anesthesiologist: Marline Backbone F Preanesthetic Checklist Completed: patient identified, site marked, surgical consent, pre-op evaluation, timeout performed, IV checked, risks and benefits discussed, monitors and equipment checked and at surgeon's request Spinal Block Patient position: sitting Prep: Betadine Patient monitoring: heart rate, continuous pulse ox and blood pressure Approach: right paramedian Location: L3-4 Injection technique: single-shot Needle Needle type: Tuohy  Needle gauge: 25 G Needle length: 9 cm Needle insertion depth: 6 cm Assessment Sensory level: T10

## 2015-06-03 NOTE — Care Management Note (Signed)
Case Management Note  Patient Details  Name: Steven Phelps MRN: [id number] Date of Birth: Feb 08, 1931  Subjective/Objective:                  Met with patient and his son to discuss discharge planning; Patient just received from PACU. Patient states he lives alone but his son has stated that he can stay with him (like before after cardiac surgery) if his insurance will not cover SNF. Patient has arranged a bed at [facility] per patient- pending his insurance authorization. He does NOT have a front-wheeled walker. He does not remember the name of the home health agency he used about 6 years ago. He uses [facility] for Rx [phone]. PT evaluation for tomorrow.  Action/Plan: RNCM will continue to follow. List of home health agencies shared with patient/son.  Expected Discharge Date:                  Expected Discharge Plan:     In-House Referral:  Clinical Social Work  Discharge planning Services  CM Consult  Post Acute Care Choice:    Choice offered to:  Patient, Adult Children  DME Arranged:    DME Agency:     HH Arranged:    Justice Agency:     Status of Service:     Medicare Important Message Given:    Date Medicare IM Given:    Medicare IM give by:    Date Additional Medicare IM Given:    Additional Medicare Important Message give by:     If discussed at [place] of Stay Meetings, dates discussed:    Additional Comments:  Marshell Garfinkel, RN 06/03/2015, 1:41 PM

## 2015-06-03 NOTE — Anesthesia Preprocedure Evaluation (Addendum)
Anesthesia Evaluation  Patient identified by MRN, date of birth, ID band Patient awake    Reviewed: Allergy & Precautions, NPO status , Patient's Chart, lab work & pertinent test results  History of Anesthesia Complications Negative for: history of anesthetic complications  Airway Mallampati: III       Dental  (+) Teeth Intact   Pulmonary former smoker,    + decreased breath sounds      Cardiovascular hypertension, Pt. on medications IIRhythm:Regular Rate:Normal     Neuro/Psych negative neurological ROS  negative psych ROS   GI/Hepatic negative GI ROS, Neg liver ROS,   Endo/Other  diabetes, Well Controlled, Type 1, Insulin Dependent  Renal/GU negative Renal ROS  negative genitourinary   Musculoskeletal  (+) Arthritis -, Osteoarthritis,    Abdominal (+) + obese,   Peds negative pediatric ROS (+)  Hematology negative hematology ROS (+)   Anesthesia Other Findings   Reproductive/Obstetrics negative OB ROS                            Anesthesia Physical Anesthesia Plan  ASA: III  Anesthesia Plan: Spinal   Post-op Pain Management: MAC Combined w/ Regional for Post-op pain   Induction:   Airway Management Planned: Natural Airway  Additional Equipment:   Intra-op Plan:   Post-operative Plan:   Informed Consent: I have reviewed the patients History and Physical, chart, labs and discussed the procedure including the risks, benefits and alternatives for the proposed anesthesia with the patient or authorized representative who has indicated his/her understanding and acceptance.     Plan Discussed with: CRNA  Anesthesia Plan Comments:         Anesthesia Quick Evaluation

## 2015-06-03 NOTE — Brief Op Note (Signed)
06/03/2015  10:42 AM  PATIENT:  Steven Phelps  81 y.o. male  PRE-OPERATIVE DIAGNOSIS:  DEGENERATIVE OSTEOARTHRITIS left knee  POST-OPERATIVE DIAGNOSIS:  DEGENERATIVE OSTEOARTHRITIS left knee  PROCEDURE:  Procedure(s): TOTAL KNEE ARTHROPLASTY (Left) using computer-assisted navigation  SURGEON:  Surgeon(s) and Role:    * Dereck Leep, MD - Primary  ASSISTANTS: Vance Peper, PA   ANESTHESIA:   spinal  EBL:  Total I/O In: 1500 [I.V.:1500] Out: 200 [Urine:125; Blood:75]  BLOOD ADMINISTERED:none  DRAINS: 2 drains to reinfusion system   LOCAL MEDICATIONS USED:  MARCAINE    and OTHER Exparel  SPECIMEN:  No Specimen  DISPOSITION OF SPECIMEN:  N/A  COUNTS:  YES  TOURNIQUET:   87 min  DICTATION: .Dragon Dictation  PLAN OF CARE: Admit to inpatient   PATIENT DISPOSITION:  PACU - hemodynamically stable.   Delay start of Pharmacological VTE agent (>24hrs) due to surgical blood loss or risk of bleeding: yes

## 2015-06-03 NOTE — Progress Notes (Signed)
POD 0. LTKR with Dr. Marry Guan. Teds, foot pumps, & polar care in place. Gave 5mg  oxycodone at 1313. Patient states 0/10 pain level at this time. Second autovac in place. VSS. Continue to assess.

## 2015-06-03 NOTE — H&P (Signed)
The patient has been re-examined, and the chart reviewed, and there have been no interval changes to the documented history and physical.    The risks, benefits, and alternatives have been discussed at length, and the patient is willing to proceed.   

## 2015-06-03 NOTE — Op Note (Signed)
DATE OF SURGERY:  06/03/2015  PATIENT NAME:  CHAZ MCGLASSON   DOB: 05/16/1931  MRN: [id number]  PRE-OPERATIVE DIAGNOSIS: Degenerative arthrosis of the left knee, primary  POST-OPERATIVE DIAGNOSIS:  Same  PROCEDURE:  Left total knee arthroplasty using computer-assisted navigation  SURGEON:  Marciano Sequin. M.D.  ASSISTANT:  Vance Peper, PA (present and scrubbed throughout the case, critical for assistance with exposure, retraction, instrumentation, and closure)  ANESTHESIA: spinal  ESTIMATED BLOOD LOSS: 75 mL  FLUIDS REPLACED: 1500 mL of crystalloid  TOURNIQUET TIME: 87 minutes  DRAINS: 2 medium drains to a reinfusion system  SOFT TISSUE RELEASES: Anterior cruciate ligament, posterior cruciate ligament, deep medial collateral ligament, patellofemoral ligament   IMPLANTS UTILIZED: DePuy PFC Sigma size 3 posterior stabilized femoral component (cemented), size 4 MBT tibial component (cemented), 41 mm 3 peg oval dome patella (cemented), and a 17.5 mm stabilized rotating platform polyethylene insert.  INDICATIONS FOR SURGERY: HUMZA TALLERICO is a 81 y.o. year old male with a long history of progressive knee pain. X-rays demonstrated severe degenerative changes in tricompartmental fashion. He had not seen any significant improvement despite conservative nonsurgical intervention. After discussion of the risks and benefits of surgical intervention, the patient expressed understanding of the risks benefits and agree with plans for total knee arthroplasty.   The risks, benefits, and alternatives were discussed at length including but not limited to the risks of infection, bleeding, nerve injury, stiffness, blood clots, the need for revision surgery, cardiopulmonary complications, among others, and they were willing to proceed.  PROCEDURE IN DETAIL: The patient was brought into the operating room and, after adequate spinal anesthesia was achieved, a tourniquet was placed on the patient's upper  thigh. The patient's knee and leg were cleaned and prepped with alcohol and DuraPrep and draped in the usual sterile fashion. A "timeout" was performed as per usual protocol. The lower extremity was exsanguinated using an Esmarch, and the tourniquet was inflated to 300 mmHg. An anterior longitudinal incision was made followed by a standard mid vastus approach. The deep fibers of the medial collateral ligament were elevated in a subperiosteal fashion off of the medial flare of the tibia so as to maintain a continuous soft tissue sleeve. The patella was subluxed laterally and the patellofemoral ligament was incised. Inspection of the knee demonstrated severe degenerative changes with full-thickness loss of articular cartilage. Osteophytes were debrided using a rongeur. Anterior and posterior cruciate ligaments were excised. Two 4.0 mm Schanz pins were inserted in the femur and into the tibia for attachment of the array of trackers used for computer-assisted navigation. Hip center was identified using a circumduction technique. Distal landmarks were mapped using the computer. The distal femur and proximal tibia were mapped using the computer. The distal femoral cutting guide was positioned using computer-assisted navigation so as to achieve a 5 distal valgus cut. The femur was sized and it was felt that a size 3 femoral component was appropriate. A size 3 femoral cutting guide was positioned and the anterior cut was performed and verified using the computer. This was followed by completion of the posterior and chamfer cuts. Femoral cutting guide for the central box was then positioned in the center box cut was performed.  Attention was then directed to the proximal tibia. Medial and lateral menisci were excised. The extramedullary tibial cutting guide was positioned using computer-assisted navigation so as to achieve a 0 varus-valgus alignment and 0 posterior slope. The cut was performed and verified using the  computer. The  proximal tibia was sized and it was felt that a size 4 tibial tray was appropriate. Tibial and femoral trials were inserted followed by insertion of a 10 and subsequently a 17.5 mm polyethylene insert. This allowed for excellent mediolateral soft tissue balancing both in flexion and in full extension. Finally, the patella was cut and prepared so as to accommodate a 41 mm 3 peg oval dome patella. A patella trial was placed and the knee was placed through a range of motion with excellent patellar tracking appreciated. The femoral trial was removed after debridement of posterior osteophytes. The central post-hole for the tibial component was reamed followed by insertion of a keel punch. Tibial trials were then removed. Cut surfaces of bone were irrigated with copious amounts of normal saline with antibiotic solution using pulsatile lavage and then suctioned dry. Polymethylmethacrylate cement with gentamicin was prepared in the usual fashion using a vacuum mixer. Cement was applied to the cut surface of the proximal tibia as well as along the undersurface of a size 4 MBT tibial component. Tibial component was positioned and impacted into place. Excess cement was removed using [employer] fast streamer. Cement was then applied to the cut surfaces of the femur as well as along the posterior flanges of the size 3 femoral component. The femoral component was positioned and impacted into place. Excess cement was removed using [employer] fast streamer. A 17.5 mm polyethylene trial was inserted and the knee was brought into full extension with steady axial compression applied. Finally, cement was applied to the backside of a 41 mm 3 peg oval dome patella and the patellar component was positioned and patellar clamp applied. Excess cement was removed using [employer] fast streamer. After adequate curing of the cement, the tourniquet was deflated after a total tourniquet time of 87 minutes. Hemostasis was achieved using electrocautery. The  knee was irrigated with copious amounts of normal saline with antibiotic solution using pulsatile lavage and then suctioned dry. 20 mL of 1.3% Exparel in 40 mL of normal saline was injected along the posterior capsule, medial and lateral gutters, and along the arthrotomy site. A 17.5 mm stabilized rotating platform polyethylene insert was inserted and the knee was placed through a range of motion with excellent mediolateral soft tissue balancing appreciated and excellent patellar tracking noted. 2 medium drains were placed in the wound bed and brought out through separate stab incisions to be attached to a reinfusion system. The medial parapatellar portion of the incision was reapproximated using interrupted sutures of #1 Vicryl. Subcutaneous tissue was then injected with a total of 30 cc of 0.25% Marcaine with epinephrine. Subcutaneous tissue was approximated in layers using first #0 Vicryl followed #2-0 Vicryl. The skin was approximated with skin staples. A sterile dressing was applied.  The patient tolerated the procedure well and was transported to the recovery room in stable condition.    Ziara Thelander P. Holley Bouche., M.D.

## 2015-06-04 LAB — CBC
HEMATOCRIT: 34.1 % — AB (ref 40.0–52.0)
Hemoglobin: 11.5 g/dL — ABNORMAL LOW (ref 13.0–18.0)
MCH: 32.4 pg (ref 26.0–34.0)
MCHC: 33.7 g/dL (ref 32.0–36.0)
MCV: 96.1 fL (ref 80.0–100.0)
PLATELETS: 107 10*3/uL — AB (ref 150–440)
RBC: 3.55 MIL/uL — AB (ref 4.40–5.90)
RDW: 15 % — ABNORMAL HIGH (ref 11.5–14.5)
WBC: 5.3 10*3/uL (ref 3.8–10.6)

## 2015-06-04 LAB — GLUCOSE, CAPILLARY
GLUCOSE-CAPILLARY: 189 mg/dL — AB (ref 65–99)
GLUCOSE-CAPILLARY: 122 mg/dL — AB (ref 65–99)
GLUCOSE-CAPILLARY: 153 mg/dL — AB (ref 65–99)
Glucose-Capillary: 130 mg/dL — ABNORMAL HIGH (ref 65–99)

## 2015-06-04 LAB — BASIC METABOLIC PANEL
Anion gap: 6 (ref 5–15)
BUN: 15 mg/dL (ref 6–20)
CHLORIDE: 107 mmol/L (ref 101–111)
CO2: 28 mmol/L (ref 22–32)
Calcium: 8.5 mg/dL — ABNORMAL LOW (ref 8.9–10.3)
Creatinine, Ser: 1.09 mg/dL (ref 0.61–1.24)
GFR calc Af Amer: >60 mL/min (ref >60)
GFR calc non Af Amer: >60 mL/min (ref >60)
Glucose, Bld: 145 mg/dL — ABNORMAL HIGH (ref 65–99)
Potassium: 4.4 mmol/L (ref 3.5–5.1)
SODIUM: 141 mmol/L (ref 135–145)

## 2015-06-04 MED ORDER — ASPIRIN 81 MG PO TABS: 81.0000 mg | ORAL_TABLET | Freq: Every day | ORAL | Status: AC

## 2015-06-04 MED ORDER — ENOXAPARIN SODIUM 40 MG/0.4ML ~~LOC~~ SOLN: 40.0000 mg | SUBCUTANEOUS | Status: DC

## 2015-06-04 MED ORDER — OXYCODONE HCL 5 MG PO TABS: 5.0000 mg | ORAL_TABLET | ORAL | Status: DC | PRN

## 2015-06-04 MED ORDER — TRAMADOL HCL 50 MG PO TABS: 50.0000 mg | ORAL_TABLET | ORAL | Status: DC | PRN

## 2015-06-04 NOTE — Anesthesia Postprocedure Evaluation (Signed)
  Anesthesia Post-op Note  Patient: Steven Phelps  Procedure(s) Performed: Procedure(s): TOTAL KNEE ARTHROPLASTY (Left)  Anesthesia type:Spinal  Patient location: PACU  Post pain: Pain level controlled  Post assessment: Post-op Vital signs reviewed, Patient's Cardiovascular Status Stable, Respiratory Function Stable, Patent Airway and No signs of Nausea or vomiting  Post vital signs: Reviewed and stable  Last Vitals:  Filed Vitals:   06/04/15 0516  BP: 126/60  Pulse: 80  Temp: 36.9 C  Resp: 16    Level of consciousness: awake, alert  and patient cooperative  Complications: No apparent anesthesia complications

## 2015-06-04 NOTE — Progress Notes (Signed)
Clinical Social Worker (CSW) received SNF consult. PT is recommending home health. RN Case Manager aware of above. Please reconsult if future social work needs arise. CSW signing off.   Genelle Economou Morgan, LCSWA [phone] 

## 2015-06-04 NOTE — Evaluation (Signed)
Physical Therapy Evaluation Patient Details Name: Steven Phelps MRN: [id number] DOB: 1930/12/06 Today's Date: 06/04/2015   History of Present Illness  Pt is an 81 y.o. male s/p L TKA 06/03/15.  Clinical Impression  Currently pt demonstrates impairments with L LE strength, L knee ROM, pain, and limitations with functional mobility.  Prior to admission, pt was independent without AD.  Pt lives alone in 1 level home with 1/2 step to enter (no railing).  Currently pt is supervision supine to sit; CGA to min assist sit to/from stand; and CGA to occasional min assist to steady ambulating 25 feet with RW.  Pt would benefit from skilled PT to address above noted impairments and functional limitations.  Recommend pt discharge to home with HHPT and use of RW when medically appropriate.  Pt voicing concerns regarding being able to function at home without being able to drive for 2 weeks post surgery and feels he should go to STR for more therapy; care management notified.     Follow Up Recommendations Home health PT    Equipment Recommendations  Rolling walker with 5" wheels    Recommendations for Other Services       Precautions / Restrictions Precautions Precautions: Fall Precaution Booklet Issued: Yes (comment) Restrictions Weight Bearing Restrictions: Yes LLE Weight Bearing: Weight bearing as tolerated      Mobility  Bed Mobility Overal bed mobility: Needs Assistance Bed Mobility: Supine to Sit     Supine to sit: Supervision;HOB elevated        Transfers Overall transfer level: Needs assistance Equipment used: Rolling walker (2 wheeled) Transfers: Sit to/from [facility] Sit to Stand: Min guard;Min assist Stand pivot transfers: Min guard       General transfer comment: vc's required for hand and feet placement  Ambulation/Gait Ambulation/Gait assistance: Min guard;Min assist Ambulation Distance (Feet): 25 Feet Assistive device: Rolling walker (2  wheeled)       General Gait Details: mild antalgic gait; mild decreased stance time L LE; mild decreased cadence; mostly steady  Science writer    Modified Rankin (Stroke Patients Only)       Balance Overall balance assessment: Needs assistance Sitting-balance support: No upper extremity supported Sitting balance-Leahy Scale: Normal     Standing balance support: Bilateral upper extremity supported Standing balance-Leahy Scale: Good                               Pertinent Vitals/Pain Pain Assessment: 0-10 Pain Score: 6  Pain Location: L knee pain Pain Descriptors / Indicators: Aching;Sore;Tender Pain Intervention(s): Limited activity within patient's tolerance;Monitored during session;Premedicated before session;Repositioned;Ice applied  During session, pt's HR 86-89 bpm and O2 >94% on room air.    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  Pt agreeable to PT session.    Exercises Total Joint Exercises Goniometric ROM: L knee extension 10 degrees short of neutral semi-supine; L knee flexion 77 degrees in sitting.  Treatment:  Performed semi-supine B LE therapeutic exercise x 10 reps:  Ankle pumps (AROM B LE's); quad sets x3 second holds (AROM B LE's); SAQ's (AROM R; AROM L); heelslides (AROM R; AAROM L), hip abd/adduction (AROM R; AAROM L), and SLR (AROM R; AROM L).  Pt required vc's and tactile cues for correct technique with exercises.       Assessment/Plan    PT Assessment Patient needs continued PT services  PT Diagnosis Acute pain;Abnormality of gait   PT Problem List Decreased strength;Decreased range of motion;Decreased balance;Decreased mobility;Pain  PT Treatment Interventions DME instruction;Gait training;Stair training;Functional mobility training;Therapeutic activities;Therapeutic exercise;Balance training;Patient/family education   PT Goals (Current goals can be found in the Care Plan section) Acute Rehab PT Goals Patient Stated Goal: To have less pain PT Goal Formulation: With patient Time For Goal Achievement: 06/18/15 Potential to Achieve Goals: Good    Frequency BID   Barriers to discharge        Co-evaluation               End of Session Equipment Utilized During Treatment: Gait belt Activity Tolerance: Patient tolerated treatment well Patient left: in chair;with call bell/phone within reach;with chair alarm set;with SCD's reapplied (B heels elevated via towel rolls)           Time: 0263-7858 PT Time Calculation (min) (ACUTE ONLY): 30 min   Charges:   PT Evaluation $Initial PT Evaluation Tier I: 1 Procedure PT Treatments $Therapeutic Exercise: 8-22 mins   PT G CodesLeitha Bleak 06-14-15, 11:11 AM Leitha Bleak, [place]

## 2015-06-04 NOTE — Evaluation (Signed)
Occupational Therapy Evaluation Patient Details Name: Steven Phelps MRN: [id number] DOB: 1931/04/19 Today's Date: 06/04/2015    History of Present Illness  This patient is an 81 year old male who came to [facility] for a L TKR   Clinical Impression   This patient is an 81 year old male who came to [facility] for a L total knee replacement.  Patient lives in a one story home with 2 steps to enter.  He had been independent with ADL and functional mobility. He now requires min assistance for lower body dressing practicing Donned/doffed socks and pants to knees (drain still in place) and verbal cues.       Follow Up Recommendations       Equipment Recommendations    Possible hip kit but may not need in a few days. Written list of vendors given.   Recommendations for Other Services       Precautions / Restrictions Precautions Precautions: Knee Restrictions Weight Bearing Restrictions: Yes LLE Weight Bearing: Weight bearing as tolerated      Mobility Bed Mobility                  Transfers                      Balance                                            ADL                                         General ADL Comments: Patient had been independent, however, now needs minimal assist and precticed donning doffing pants to knees (drain still in place) using hip kit with verbal cues.     Vision     Perception     Praxis      Pertinent Vitals/Pain       Hand Dominance     Extremity/Trunk Assessment Upper Extremity Assessment Upper Extremity Assessment: Overall WFL for tasks assessed           Communication Communication Communication: No difficulties   Cognition Arousal/Alertness: Awake/alert Behavior During Therapy: WFL for tasks assessed/performed Overall Cognitive Status: Within Functional Limits for tasks assessed                      General Comments       Exercises       Shoulder Instructions      Home Living Family/patient expects to be discharged to:: Private residence Living Arrangements: Alone Available Help at Discharge:  (Family but family works) Type of Home: House Home Access: Stairs to enter Technical brewer of Steps: 2   Home Layout: One level     Bathroom Shower/Tub: Aeronautical engineer: None          Prior Functioning/Environment Level of Independence: Independent        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, contracting Diagnosis: Generalized weakness   OT Problem List: Decreased activity tolerance   OT Treatment/Interventions:      OT Goals(Current goals can be found in the care plan section) Acute Rehab OT Goals Patient  Stated Goal: Get back to independence OT Goal Formulation: With patient Time For Goal Achievement: 06/18/15  OT Frequency:     Barriers to D/C:            Co-evaluation              End of Session Equipment Utilized During Treatment:  (Hip kit)  Activity Tolerance: Patient tolerated treatment well Patient left: in chair;with call bell/phone within reach;with chair alarm set   Time: 0938-1000 OT Time Calculation (min): 22 min Charges:  OT General Charges $OT Visit: 1 Procedure OT Evaluation $Initial OT Evaluation Tier I: 1 Procedure OT Treatments $Self Care/Home Management : 8-22 mins G-Codes:    Myrene Galas, MS/OTR/L  06/04/2015, 10:23 AM

## 2015-06-04 NOTE — Plan of Care (Signed)
Problem: Consults Goal: Diagnosis- Total Joint Replacement Outcome: Completed/Met Date Met:  06/04/15 Primary Total Knee

## 2015-06-04 NOTE — Care Management (Signed)
Patient is doing quite well with PT but still insisting going to SNF at [facility] which he may have to pay out of pocket for. His son is not concerned with patient returning home with him as patient has stayed there before after cardiac surgery. He will need a rolling walker and to pick a home health agency. PT to continue to work with patient. RNCM to follow for Lovenox, home health, and rolling walker/DME. Message left for Dr. Marry Guan related to patient progress; waiting for callback from MD.

## 2015-06-04 NOTE — Progress Notes (Signed)
Patient working well with PT. Walked 160 ft. Pain continues to be well managed with alternating po pain meds. Daughter bedside this morning. Patient currently resting quietly. Continue to assess.

## 2015-06-04 NOTE — Progress Notes (Signed)
Physical Therapy Treatment Patient Details Name: Steven Phelps MRN: [id number] DOB: 30-Apr-1931 Today's Date: 06/04/2015    History of Present Illness Pt is an 81 y.o. male s/p L TKA 06/03/15.    PT Comments    Pt progressing well with physical therapy and ambulated 160 feet with RW CGA this afternoon.  Pt continues to report that he feels like he needs to go to STR upon discharge but appears to be progressing well for POD#1 and anticipate with continued progress, pt will be able to discharge home with HHPT.    Follow Up Recommendations  Home health PT     Equipment Recommendations  Rolling walker with 5" wheels    Recommendations for Other Services       Precautions / Restrictions Precautions Precautions: Fall Restrictions Weight Bearing Restrictions: Yes LLE Weight Bearing: Weight bearing as tolerated    Mobility  Bed Mobility Overal bed mobility: Needs Assistance Bed Mobility: Supine to Sit;Sit to Supine     Supine to sit: Supervision;HOB elevated Sit to supine: Supervision;HOB elevated   General bed mobility comments: pt able to lift L LE on and off the bed without assist  Transfers Overall transfer level: Needs assistance Equipment used: Rolling walker (2 wheeled) Transfers: Sit to/from [facility] Sit to Stand: Min guard Stand pivot transfers: Min guard       General transfer comment: vc's required for hand and feet placement  Ambulation/Gait Ambulation/Gait assistance: Min guard Ambulation Distance (Feet): 160 Feet Assistive device: Rolling walker (2 wheeled)       General Gait Details: mild antalgic gait; mild decreased stance time L LE; L LE externally rotated compared to R LE; steady   Financial trader Rankin (Stroke Patients Only)       Balance                                    Cognition Arousal/Alertness: Awake/alert Behavior During Therapy: WFL for tasks  assessed/performed Overall Cognitive Status: Within Functional Limits for tasks assessed                      Exercises   Performed sitting exercises x 10 reps B LE's:  LAQ's (AROM R; AROM L); marching/hip flexion (AROM R; AROM L); L knee flexion self stretches 3x15 seconds.  Pt required vc's and tactile cues for correct technique with exercises.    General Comments  Nursing cleared pt for participation in PT and pt agreeable to PT session.  Pt reports he was sore sitting up in the chair and requesting back to bed end of session.      Pertinent Vitals/Pain Pain Assessment: 0-10 Pain Score: 6  Pain Location: L knee pain Pain Descriptors / Indicators: Sore Pain Intervention(s): Limited activity within patient's tolerance;Monitored during session;Premedicated before session;Repositioned;Ice applied  Vitals stable and WFL throughout treatment session.     Home Living                      Prior Function            PT Goals (current goals can now be found in the care plan section) Acute Rehab PT Goals Patient Stated Goal: To have less pain PT Goal Formulation: With patient Time For Goal Achievement: 06/18/15 Potential to Achieve Goals: Good Progress  towards PT goals: Progressing toward goals    Frequency  BID    PT Plan Current plan remains appropriate    Co-evaluation             End of Session Equipment Utilized During Treatment: Gait belt Activity Tolerance: Patient tolerated treatment well Patient left: in bed;with call bell/phone within reach;with bed alarm set;with SCD's reapplied (B heels elevated via towel rolls)     Time: 9532-0233 PT Time Calculation (min) (ACUTE ONLY): 27 min  Charges:  $Gait Training: 8-22 mins $Therapeutic Exercise: 8-22 mins                    G CodesLeitha Bleak June 25, 2015, 4:21 PM Leitha Bleak, [place]

## 2015-06-04 NOTE — Progress Notes (Signed)
Per RN Case Manager patient is adamant about going to SNF. Clinical Education officer, museum (CSW) started [employer] authorization and faxed in clinicals. [employer] will probably deny patient SNF as he is walking 160 feet. CSW also started bed search. CSW will continue to follow and assist as needed.   Blima Rich, [place] [phone]

## 2015-06-04 NOTE — Discharge Instructions (Signed)
° °TOTAL KNEE REPLACEMENT POSTOPERATIVE DIRECTIONS ° °Knee Rehabilitation, Guidelines Following Surgery  °Results after knee surgery are often greatly improved when you follow the exercise, range of motion and muscle strengthening exercises prescribed by your doctor. Safety measures are also important to protect the knee from further injury. Any time any of these exercises cause you to have increased pain or swelling in your knee joint, decrease the amount until you are comfortable again and slowly increase them. If you have problems or questions, call your caregiver or physical therapist for advice.  ° °HOME CARE INSTRUCTIONS  °Remove items at home which could result in a fall. This includes throw rugs or furniture in walking pathways.  °· Continue to use polar care unit on the knee for pain and swelling from surgery. You may notice swelling that will progress down to the foot and ankle.  This is normal after surgery.  Elevate the leg when you are not up walking on it.   °· Continue to use the breathing machine which will help keep your temperature down.  It is common for your temperature to cycle up and down following surgery, especially at night when you are not up moving around and exerting yourself.  The breathing machine keeps your lungs expanded and your temperature down. °· Do not place pillow under knee, focus on keeping the knee STRAIGHT while resting ° °DIET °You may resume your previous home diet once your are discharged from the hospital. ° °DRESSING / WOUND CARE / SHOWERING °You may start showering once staples have been removed. Change the surgical dressing as needed. ° °ACTIVITY °Walk with your walker as instructed. °Use walker as long as suggested by your caregivers. °Avoid periods of inactivity such as sitting longer than an hour when not asleep. This helps prevent blood clots.  °You may resume a sexual relationship in one month or when given the OK by your doctor.  °You may return to work once  you are cleared by your doctor.  °Do not drive a car for 6 weeks or until released by you surgeon.  °Do not drive while taking narcotics. ° °WEIGHT BEARING °Weight bearing as tolerated with assist device (walker, cane, etc) as directed, use it as long as suggested by your surgeon or therapist, typically at least 4-6 weeks. ° °POSTOPERATIVE CONSTIPATION PROTOCOL °Constipation - defined medically as fewer than three stools per week and severe constipation as less than one stool per week. ° °One of the most common issues patients have following surgery is constipation.  Even if you have a regular bowel pattern at home, your normal regimen is likely to be disrupted due to multiple reasons following surgery.  Combination of anesthesia, postoperative narcotics, change in appetite and fluid intake all can affect your bowels.  In order to avoid complications following surgery, here are some recommendations in order to help you during your recovery period. ° °Colace (docusate) - Pick up an over-the-counter form of Colace or another stool softener and take twice a day as long as you are requiring postoperative pain medications.  Take with a full glass of water daily.  If you experience loose stools or diarrhea, hold the colace until you stool forms back up.  If your symptoms do not get better within 1 week or if they get worse, check with your doctor. ° °Dulcolax (bisacodyl) - Pick up over-the-counter and take as directed by the product packaging as needed to assist with the movement of your bowels.  Take with a   full glass of water.  Use this product as needed if not relieved by Colace only.   MiraLax (polyethylene glycol) - Pick up over-the-counter to have on hand.  MiraLax is a solution that will increase the amount of water in your bowels to assist with bowel movements.  Take as directed and can mix with a glass of water, juice, soda, coffee, or tea.  Take if you go more than two days without a movement. Do not use  MiraLax more than once per day. Call your doctor if you are still constipated or irregular after using this medication for 7 days in a row.  If you continue to have problems with postoperative constipation, please contact the office for further assistance and recommendations.  If you experience "the worst abdominal pain ever" or develop nausea or vomiting, please contact the office immediatly for further recommendations for treatment.  ITCHING  If you experience itching with your medications, try taking only a single pain pill, or even half a pain pill at a time.  You can also use Benadryl over the counter for itching or also to help with sleep.   TED HOSE STOCKINGS Wear the elastic stockings on both legs for  6 weeks following surgery during the day but you may remove then at night for sleeping.  MEDICATIONS See your medication summary on the After Visit Summary that the nursing staff will review with you prior to discharge.  You may have some home medications which will be placed on hold until you complete the course of blood thinner medication.  It is important for you to complete the blood thinner medication as prescribed by your surgeon.  Continue your approved medications as instructed at time of discharge.  PRECAUTIONS If you experience chest pain or shortness of breath - call 911 immediately for transfer to the hospital emergency department.  If you develop a fever greater that 101 F, purulent drainage from wound, increased redness or drainage from wound, foul odor from the wound/dressing, or calf pain - CONTACT YOUR SURGEON.                                                   FOLLOW-UP APPOINTMENTS Make sure you keep all of your appointments after your operation with your surgeon and caregivers. You should call the office at the above phone number and make an appointment for approximately two weeks after the date of your surgery or on the date instructed by your surgeon outlined in the  "After Visit Summary".   RANGE OF MOTION AND STRENGTHENING EXERCISES  Rehabilitation of the knee is important following a knee injury or an operation. After just a few days of immobilization, the muscles of the thigh which control the knee become weakened and shrink (atrophy). Knee exercises are designed to build up the tone and strength of the thigh muscles and to improve knee motion. Often times heat used for twenty to thirty minutes before working out will loosen up your tissues and help with improving the range of motion but do not use heat for the first two weeks following surgery. These exercises can be done on a training (exercise) mat, on the floor, on a table or on a bed. Use what ever works the best and is most comfortable for you Knee exercises include:  Leg Lifts - While your knee is  still immobilized in a splint or cast, you can do straight leg raises. Lift the leg to 60 degrees, hold for 3 sec, and slowly lower the leg. Repeat 10-20 times 2-3 times daily. Perform this exercise against resistance later as your knee gets better.  Quad and Hamstring Sets - Tighten up the muscle on the front of the thigh (Quad) and hold for 5-10 sec. Repeat this 10-20 times hourly. Hamstring sets are done by pushing the foot backward against an object and holding for 5-10 sec. Repeat as with quad sets.   Leg Slides: Lying on your back, slowly slide your foot toward your buttocks, bending your knee up off the floor (only go as far as is comfortable). Then slowly slide your foot back down until your leg is flat on the floor again.  Angel Wings: Lying on your back spread your legs to the side as far apart as you can without causing discomfort.  A rehabilitation program following serious knee injuries can speed recovery and prevent re-injury in the future due to weakened muscles. Contact your doctor or a physical therapist for more information on knee rehabilitation.   IF YOU ARE TRANSFERRED TO A SKILLED REHAB  FACILITY If the patient is transferred to a skilled rehab facility following release from the hospital, a list of the current medications will be sent to the facility for the patient to continue.  When discharged from the skilled rehab facility, please have the facility set up the patient's [place] prior to being released. Also, the skilled facility will be responsible for providing the patient with their medications at time of release from the facility to include their pain medication, the muscle relaxants, and their blood thinner medication. If the patient is still at the rehab facility at time of the two week follow up appointment, the skilled rehab facility will also need to assist the patient in arranging follow up appointment in our office and any transportation needs.  MAKE SURE YOU:  Understand these instructions.  Get help right away if you are not doing well or get worse.

## 2015-06-04 NOTE — Progress Notes (Signed)
   Subjective: 1 Day Post-Op Procedure(s) (LRB): TOTAL KNEE ARTHROPLASTY (Left) Patient reports pain as 3 on 0-10 scale.   Patient is well, and has had no acute complaints or problems We will start therapy today.  Plan is to go Rehab after hospital stay. Patient would like to go to [facility]  no nausea and no vomiting Patient denies any chest pains or shortness of breath. Objective: Vital signs in last 24 hours: Temp:  [96.2 F (35.7 C)-98.4 F (36.9 C)] 98.4 F (36.9 C) (06/30 0516) Pulse Rate:  [69-90] 80 (06/30 0516) Resp:  [16-18] 16 (06/30 0516) BP: (108-131)/(53-69) 126/60 mmHg (06/30 0516) SpO2:  [93 %-98 %] 96 % (06/30 0516) Weight:  [102.513 kg (226 lb)] 102.513 kg (226 lb) (06/29 1200) Patient has original dressing in place. Heels are non tender and elevated off the bed using rolled towels Intake/Output from previous day: 06/29 0701 - 06/30 0700 In: 3740 [P.O.:240; I.V.:2700; Blood:300; IV Piggyback:500] Out: 2625 [Urine:2050; Drains:500; Blood:75] Intake/Output this shift:     Recent Labs  06/04/15 0437  HGB 11.5*    Recent Labs  06/04/15 0437  WBC 5.3  RBC 3.55*  HCT 34.1*  PLT 107*    Recent Labs  06/04/15 0437  NA 141  K 4.4  CL 107  CO2 28  BUN 15  CREATININE 1.09  GLUCOSE 145*  CALCIUM 8.5*   No results for input(s): LABPT, INR in the last 72 hours.  EXAM General - Patient is Alert, Disorganized and Oriented Extremity - Neurologically intact Neurovascular intact Sensation intact distally Intact pulses distally Dorsiflexion/Plantar flexion intact Dressing - dressing C/D/I Motor Function - intact, moving foot and toes well on exam. Good gross motor strength is dorsiflexion and plantarflexion of the foot  Past Medical History  Diagnosis Date  . Hypertension   . Heart murmur   . Shortness of breath dyspnea   . Diabetes mellitus without complication   . Arthritis   . Cancer     skin  . Glaucoma (increased eye pressure)      Assessment/Plan: 1 Day Post-Op Procedure(s) (LRB): TOTAL KNEE ARTHROPLASTY (Left) Active Problems:   Left knee DJD   Total knee replacement status  Estimated body mass index is 34.37 kg/(m^2) as calculated from the following:   Height as of this encounter: 5\' 8"  (1.727 m).   Weight as of this encounter: 102.513 kg (226 lb). Advance diet Up with therapy D/C IV fluids Discharge to SNF on Saturday  Labs: Were reviewed on today's visit DVT Prophylaxis - Lovenox, Foot Pumps and TED hose Weight-Bearing as tolerated to left leg Need to start working on having a bowel movement today D/C O2 and Pulse OX and try on Room [employer] R. Hansford Carson 06/04/2015, 7:43 AM   F2

## 2015-06-05 ENCOUNTER — Encounter: Admission: RE | Admit: 2015-06-05 | Payer: Medicare Other | Source: Ambulatory Visit | Admitting: Internal Medicine

## 2015-06-05 LAB — CBC
HCT: 36.1 % — ABNORMAL LOW (ref 40.0–52.0)
HEMOGLOBIN: 11.9 g/dL — AB (ref 13.0–18.0)
MCH: 31.9 pg (ref 26.0–34.0)
MCHC: 33.1 g/dL (ref 32.0–36.0)
MCV: 96.4 fL (ref 80.0–100.0)
PLATELETS: 123 10*3/uL — AB (ref 150–440)
RBC: 3.75 MIL/uL — AB (ref 4.40–5.90)
RDW: 14.7 % — AB (ref 11.5–14.5)
WBC: 5.7 10*3/uL (ref 3.8–10.6)

## 2015-06-05 LAB — GLUCOSE, CAPILLARY
GLUCOSE-CAPILLARY: 202 mg/dL — AB (ref 65–99)
Glucose-Capillary: 111 mg/dL — ABNORMAL HIGH (ref 65–99)
Glucose-Capillary: 136 mg/dL — ABNORMAL HIGH (ref 65–99)
Glucose-Capillary: 192 mg/dL — ABNORMAL HIGH (ref 65–99)

## 2015-06-05 MED ORDER — PNEUMOCOCCAL VAC POLYVALENT 25 MCG/0.5ML IJ INJ: 0.5000 mL | INJECTION | INTRAMUSCULAR | Status: DC

## 2015-06-05 NOTE — Progress Notes (Addendum)
   Subjective: 2 Days Post-Op Procedure(s) (LRB): TOTAL KNEE ARTHROPLASTY (Left) Patient reports pain as mild.   Patient is well, and has had no acute complaints or problems We'll continue with physical therapy today.  Plan is to go Rehab after hospital stay. no nausea and no vomiting Patient denies any chest pains or shortness of breath. Objective: Vital signs in last 24 hours: Temp:  [98.2 F (36.8 C)-98.9 F (37.2 C)] 98.3 F (36.8 C) (07/01 0542) Pulse Rate:  [77-91] 89 (07/01 0542) Resp:  [18-20] 20 (07/01 0542) BP: (118-145)/(60-73) 132/73 mmHg (07/01 0542) SpO2:  [93 %-96 %] 94 % (07/01 0542) well approximated incision Heels are non tender and elevated off the bed using rolled towels Intake/Output from previous day: 06/30 0701 - 07/01 0700 In: 2652 [P.O.:720; I.V.:1932] Out: 2285 [Urine:1875; Drains:410] Intake/Output this shift: Total I/O In: -  Out: 200 [Urine:200]   Recent Labs  06/04/15 0437 06/05/15 0538  HGB 11.5* 11.9*    Recent Labs  06/04/15 0437 06/05/15 0538  WBC 5.3 5.7  RBC 3.55* 3.75*  HCT 34.1* 36.1*  PLT 107* 123*    Recent Labs  06/04/15 0437  NA 141  K 4.4  CL 107  CO2 28  BUN 15  CREATININE 1.09  GLUCOSE 145*  CALCIUM 8.5*   No results for input(s): LABPT, INR in the last 72 hours.  EXAM General - Patient is Alert, Appropriate and Oriented Extremity - Neurologically intact Neurovascular intact Sensation intact distally Intact pulses distally Dorsiflexion/Plantar flexion intact Dressing - scant drainage Motor Function - intact, moving foot and toes well on exam. Patient unable to move the leg he did yesterday. Feels that this secondary to the therapy. Unable to do straight leg raise on his own. Neurologically he is intact and within normal limits.  Past Medical History  Diagnosis Date  . Hypertension   . Heart murmur   . Shortness of breath dyspnea   . Diabetes mellitus without complication   . Arthritis   .  Cancer     skin  . Glaucoma (increased eye pressure)     Assessment/Plan: 2 Days Post-Op Procedure(s) (LRB): TOTAL KNEE ARTHROPLASTY (Left) Active Problems:   Left knee DJD   Total knee replacement status  Estimated body mass index is 34.37 kg/(m^2) as calculated from the following:   Height as of this encounter: 5\' 8"  (1.727 m).   Weight as of this encounter: 102.513 kg (226 lb). Up with therapy Plan for discharge tomorrow Discharge to SNF  Labs: Were reviewed DVT Prophylaxis - Lovenox, Foot Pumps and TED hose Weight-Bearing as tolerated to left leg Hemovac was discontinued and dressing was changed on today's Patient needs a bowel movement today Recommend short-term rehabilitation at this time. Patient unable to safely go home.  Jillyn Ledger. [place] 06/05/2015, 7:34 AM

## 2015-06-05 NOTE — Progress Notes (Addendum)
Patient tolerated PT well today. Required oxygen placement,  Slightly congested and has wheezing upon exertion. Declined first attempt at [facility] treatment but due to low saturation levels approved second attempt.  Placed on oxygen 2 liters due to desaturation prior to treatment.  Encouraged deep breathing and coughing.  PO pain medication managing pain.  Plan to discharge to [facility] tomorrow.  Patient still needs to have bowel movement.

## 2015-06-05 NOTE — Clinical Social Work Note (Signed)
Clinical Social Work Assessment  Patient Details  Name: Steven Phelps MRN: [id number] Date of Birth: July 13, 1931  Date of referral:  06/05/15               Reason for consult:  Facility Placement                Permission sought to share information with:  Chartered certified accountant granted to share information::  Yes, Verbal Permission Granted  Name::      IT sales professional::   [place]   Relationship::     Contact Information:     Housing/Transportation Living arrangements for the past 2 months:  [place] of Information:  Patient Patient Interpreter Needed:  None Criminal Activity/Legal Involvement Pertinent to Current Situation/Hospitalization:  No - Comment as needed Significant Relationships:  Adult Children Lives with:  Self Do you feel safe going back to the place where you live?  Yes Need for family participation in patient care:  Yes (Comment)  Care giving concerns:  Patient lives alone in [place].    Social Worker assessment / plan: Holiday representative (CSW) met with patient to discuss D/C plan. CSW introduced self and explained role of CSW department. Patient's daughter Steven Phelps [phone]) and son Steven Phelps [phone]) were at bedside. Patient reported that he lives alone in [place]. Patient reported that he wants to go to [facility] for rehab.   [facility] has made a bed offer. Patient accepted bed offer.    Employment status:  Retired Nurse, adult PT Recommendations:  [place] / Referral to community resources:  [place]  Patient/Family's Response to care:  Patient is agreeable rehab at [facility].   Patient/Family's Understanding of and Emotional Response to Diagnosis, Current Treatment, and Prognosis:  Patient was pleasant and sitting up in the bed.Patient thanked CSW for visit and assisting with place,emt process.   Emotional  Assessment Appearance:    Attitude/Demeanor/Rapport:    Affect (typically observed):  Pleasant Orientation:  Oriented to Self, Oriented to Place, Oriented to  Time, Oriented to Situation Alcohol / Substance use:  Not Applicable Psych involvement (Current and /or in the community):  No (Comment)  Discharge Needs  Concerns to be addressed:  Discharge Planning Concerns Readmission within the last 30 days:  No Current discharge risk:  Chronically ill, Lives alone Barriers to Discharge:  Continued Medical Work up   Elwyn Reach 06/05/2015, 3:47 PM

## 2015-06-05 NOTE — Clinical Social Work Placement (Signed)
   CLINICAL SOCIAL WORK PLACEMENT  NOTE  Date:  06/05/2015  Patient Details  Name: Steven Phelps MRN: [id number] Date of Birth: 1931/09/01  Clinical Social Work is seeking post-discharge placement for this patient at the [place] level of care (*CSW will initial, date and re-position this form in  chart as items are completed):  Yes   Patient/family provided with [place] Work Department's list of facilities offering this level of care within the geographic area requested by the patient (or if unable, by the patient's family).  Yes   Patient/family informed of their freedom to choose among providers that offer the needed level of care, that participate in Medicare, Medicaid or managed care program needed by the patient, have an available bed and are willing to accept the patient.  Yes   Patient/family informed of [place]'s ownership interest in [facility] and [facility], as well as of the fact that they are under no obligation to receive care at these facilities.  PASRR submitted to EDS on 06/04/15     PASRR number received on 06/04/15     Existing PASRR number confirmed on       FL2 transmitted to all facilities in geographic area requested by pt/family on 06/05/15     FL2 transmitted to all facilities within larger geographic area on       Patient informed that his/her managed care company has contracts with or will negotiate with certain facilities, including the following:        Yes   Patient/family informed of bed offers received.  Patient chooses bed at  [facility] )     Physician recommends and patient chooses bed at      Patient to be transferred to   on  .  Patient to be transferred to facility by       Patient family notified on   of transfer.  Name of family member notified:        PHYSICIAN       Additional Comment:    _______________________________________________ Loralyn Freshwater, LCSW 06/05/2015,  3:45 PM

## 2015-06-05 NOTE — Progress Notes (Signed)
Physical Therapy Treatment Patient Details Name: Steven Phelps MRN: [id number] DOB: [birth year] Today's Date: 06/05/2015    History of Present Illness Pt is an 81 y.o. male s/p L TKA 06/03/15.    PT Comments    Pt was seen for continued treatment and encouraged him to stretch ext on LLE and to be up in chair to clear lungs.  Tried walk without O2 and noted his sats 97% after walk.  He agreed to sit up in chair for dinner, and had legs extended given his frequent time sitting flexed, esp since he is -12 ext.    Follow Up Recommendations  SNF     Equipment Recommendations  Rolling walker with 5" wheels    Recommendations for Other Services       Precautions / Restrictions Precautions Precautions: Fall;Knee Restrictions Weight Bearing Restrictions: Yes LLE Weight Bearing: Weight bearing as tolerated    Mobility  Bed Mobility Overal bed mobility: Needs Assistance Bed Mobility: Supine to Sit     Supine to sit: Supervision        Transfers Overall transfer level: Needs assistance Equipment used: Rolling walker (2 wheeled) Transfers: Sit to/from [employer] Transfers Sit to Stand: Min assist Stand pivot transfers: Min guard       General transfer comment: reminders for hand placement at transitions  Ambulation/Gait Ambulation/Gait assistance: Min guard Ambulation Distance (Feet): 80 Feet Assistive device: Rolling walker (2 wheeled) Gait Pattern/deviations: Step-to pattern;Step-through pattern;Decreased dorsiflexion - right;Decreased dorsiflexion - left;Decreased stance time - left Gait velocity: reduced Gait velocity interpretation: Below normal speed for age/gender     Stairs            Wheelchair Mobility    Modified Rankin (Stroke Patients Only)       Balance                                    Cognition Arousal/Alertness: Awake/alert Behavior During Therapy: WFL for tasks assessed/performed Overall Cognitive Status:  Within Functional Limits for tasks assessed                      Exercises Total Joint Exercises Ankle Circles/Pumps: AROM;Both;10 reps Quad Sets: AROM;Both;10 reps Gluteal Sets: AROM;Both;10 reps Heel Slides: AROM;AAROM;Both;10 reps Straight Leg Raises: AROM;Left;5 reps Goniometric ROM: L knee ext -12 deg    General Comments General comments (skin integrity, edema, etc.): Pt was able to sit up bedside and manage to stand with bed at regular height. More capable with practice of exercises before starting gait      Pertinent Vitals/Pain Pain Assessment: Faces Pain Score: 4  Faces Pain Scale: Hurts little more Pain Location: L knee Pain Intervention(s): Limited activity within patient's tolerance;Monitored during session;Premedicated before session;Patient requesting pain meds-RN notified;Repositioned;Ice applied    Home Living                      Prior Function            PT Goals (current goals can now be found in the care plan section) Acute Rehab PT Goals Patient Stated Goal: To go to rehab Progress towards PT goals: Progressing toward goals    Frequency  BID    PT Plan      Co-evaluation             End of Session Equipment Utilized During Treatment: Gait belt Activity Tolerance: Patient limited  by pain Patient left: in chair;with call bell/phone within reach     Time: 1559-1630 PT Time Calculation (min) (ACUTE ONLY): 31 min  Charges:  $Gait Training: 8-22 mins $Therapeutic Exercise: 8-22 mins                    G Codes:      Steven Phelps 06/25/2015, 5:00 PM   Mee Hives, PT MS Acute Rehab Dept. Number: [facility] [id number] and [place] [phone]

## 2015-06-05 NOTE — Discharge Summary (Signed)
Physician Discharge Summary  Patient ID: Steven Phelps MRN: [id number] DOB/AGE: 1931/04/22 81 y.o.  Admit date: 06/03/2015 Discharge date: 06/05/2015  Admission Diagnoses:  DEGENERATIVE OSTEOARTHRITIS   Discharge Diagnoses: Patient Active Problem List   Diagnosis Date Noted  . 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 Total knee replacement status 06/03/2015    Past Medical History  Diagnosis Date  . Hypertension   . Heart murmur   . Shortness of breath dyspnea   . Diabetes mellitus without complication   . Arthritis   . Cancer     skin  . Glaucoma (increased eye pressure)      Transfusion: Patient did receive Autovac transfusion the first 6 hours postoperatively   Consultants (if any):   there was no consultations during this admission  Discharged Condition: Improved  Hospital Course: Steven Phelps is an 81 y.o. male who was admitted 06/03/2015 with a diagnosis of degenerative arthrosis left knee and went to the operating room on 06/03/2015 and underwent the above named procedures.    Surgeries:Procedure(s): TOTAL KNEE ARTHROPLASTY on 06/03/2015  ANESTHESIA: spinal  ESTIMATED BLOOD LOSS: 75 mL  FLUIDS REPLACED: 1500 mL of crystalloid  TOURNIQUET TIME: 87 minutes  DRAINS: 2 medium drains to a reinfusion system  SOFT TISSUE RELEASES: Anterior cruciate ligament, posterior cruciate ligament, deep medial collateral ligament, patellofemoral ligament   IMPLANTS UTILIZED: DePuy PFC Sigma size 3 posterior stabilized femoral component (cemented), size 4 MBT tibial component (cemented), 41 mm 3 peg oval dome patella (cemented), and a 17.5 mm stabilized rotating platform polyethylene insert.  INDICATIONS FOR SURGERY: Steven Phelps is a 81 y.o. year old male with a long history of progressive knee pain. X-rays demonstrated severe degenerative changes in tricompartmental fashion. He had not seen any significant improvement despite conservative nonsurgical intervention. After discussion of  the risks and benefits of surgical intervention, the patient expressed understanding of the risks benefits and agree with plans for total knee arthroplasty.   Patient tolerated the surgery well. No complications .Patient was taken to PACU where she was stabilized and then transferred to the orthopedic floor.  Patient started on Lovenox 30 mg  q 12 hrs. Foot pumps applied bilaterally at 80 mmHg. Heels elevated off bed with rolled towels. No evidence of DVT. Calves non tender. Negative Homan. Physical therapy started on day #1 for gait training and transfer with OT starting on  day #1 for ADL and assisted devices. Patient has done well with therapy. Ambulated 100 feet upon being discharged.  Patient's IV , foley was discharged on day #1 and hemovac was d/c on day #2.   He was given perioperative antibiotics:  Anti-infectives    Start     Dose/Rate Route Frequency Ordered Stop   06/03/15 1245  ceFAZolin (ANCEF) IVPB 2 g/50 mL premix     2 g 100 mL/hr over 30 Minutes Intravenous Every 6 hours 06/03/15 1241 06/04/15 0602   06/03/15 0615  ceFAZolin (ANCEF) IVPB 2 g/50 mL premix  Status:  Discontinued     2 g 100 mL/hr over 30 Minutes Intravenous  Once 06/03/15 0600 06/03/15 1152   06/03/15 0604  ceFAZolin (ANCEF) 2-3 GM-% IVPB SOLR    Comments:  Slemenda, Debbie: cabinet override      06/03/15 0604 06/03/15 1814    .  He was given sequential compression devices, early ambulation, and TED stockings and Lovenox for DVT prophylaxis.  He benefited maximally from the hospital stay and there were no complications.    Recent  vital signs:  Filed Vitals:   06/05/15 0756  BP: 147/72  Pulse: 91  Temp: 99.2 F (37.3 C)  Resp: 18    Recent laboratory studies:  Lab Results  Component Value Date   HGB 11.9* 06/05/2015   HGB 11.5* 06/04/2015   HGB 13.1 05/20/2015   Lab Results  Component Value Date   WBC 5.7 06/05/2015   PLT 123* 06/05/2015   Lab Results  Component Value Date   INR  0.96 05/20/2015   Lab Results  Component Value Date   NA 141 06/04/2015   K 4.4 06/04/2015   CL 107 06/04/2015   CO2 28 06/04/2015   BUN 15 06/04/2015   CREATININE 1.09 06/04/2015   GLUCOSE 145* 06/04/2015    Discharge Medications:     Medication List    TAKE these medications        albuterol 108 (90 BASE) MCG/ACT inhaler  Commonly known as:  PROVENTIL HFA;VENTOLIN HFA  Inhale 2 puffs into the lungs every 6 (six) hours as needed for wheezing or shortness of breath.     amLODipine 5 MG tablet  Commonly known as:  NORVASC  Take 5 mg by mouth daily at 6 PM.     amoxicillin 500 MG capsule  Commonly known as:  AMOXIL  Take 500 mg by mouth once. 4 capsules one hour prior to dental appointment     aspirin 81 MG tablet  Take 1 tablet (81 mg total) by mouth daily.  Start taking on:  06/20/2015     atorvastatin 40 MG tablet  Commonly known as:  LIPITOR  Take 40 mg by mouth daily at 6 PM.     CENTRUM SILVER PO  Take 1 tablet by mouth daily.     Doxycycline Hyclate 50 MG Tabs  Take 50 mg by mouth as needed (for flares).     enoxaparin 40 MG/0.4ML injection  Commonly known as:  LOVENOX  Inject 0.4 mLs (40 mg total) into the skin daily.     folic acid 846 MCG tablet  Commonly known as:  FOLVITE  Take 800 mcg by mouth daily.     furosemide 20 MG tablet  Commonly known as:  LASIX  Take 20 mg by mouth daily.     glipiZIDE 10 MG tablet  Commonly known as:  GLUCOTROL  Take 10 mg by mouth 2 (two) times daily before a meal.     insulin glargine 100 unit/mL Sopn  Commonly known as:  LANTUS  Inject 25 Units into the skin at bedtime.     latanoprost 0.005 % ophthalmic solution  Commonly known as:  XALATAN  Place 1 drop into both eyes at bedtime.     losartan 50 MG tablet  Commonly known as:  COZAAR  Take 50 mg by mouth 2 (two) times daily.     metoprolol 100 MG tablet  Commonly known as:  LOPRESSOR  Take 100 mg by mouth 2 (two) times daily.     metroNIDAZOLE 1 %  gel  Commonly known as:  METROGEL  Apply 1 application topically daily.     mometasone 50 MCG/ACT nasal spray  Commonly known as:  NASONEX  Place 2 sprays into the nose as needed.     oxyCODONE 5 MG immediate release tablet  Commonly known as:  Oxy IR/ROXICODONE  Take 1-2 tablets (5-10 mg total) by mouth every 4 (four) hours as needed for breakthrough pain.     tadalafil 5 MG tablet  Commonly known  as:  CIALIS  Take 5 mg by mouth daily as needed for erectile dysfunction.     traMADol 50 MG tablet  Commonly known as:  ULTRAM  Take 1-2 tablets (50-100 mg total) by mouth every 4 (four) hours as needed for moderate pain.     vitamin C 500 MG tablet  Commonly known as:  ASCORBIC ACID  Take 500 mg by mouth daily.     vitamin E 400 UNIT capsule  Take 400 Units by mouth daily.        Diagnostic Studies: Dg Knee Left Port  06/03/2015   CLINICAL DATA:  Postoperative total knee replacement  EXAM: PORTABLE LEFT KNEE - 1-2 VIEW  COMPARISON:  None.  FINDINGS: Frontal and lateral views obtained. The patient is status post total knee replacement with femoral and tibial prosthetic components appearing well-seated. No fracture or dislocation. Small joint effusion present. There is a spur arising from the anterior superior patella. There is a drain within the anterior knee joint region.  IMPRESSION: Prosthetic components appear well seated. No acute fracture or dislocation.   Electronically Signed   By: Lowella Grip III M.D.   On: 06/03/2015 11:46    Disposition:       Discharge Instructions    Diet - low sodium heart healthy    Complete by:  As directed      Increase activity slowly    Complete by:  As directed            Follow-up Information    Follow up with [facility] On 06/18/2015.   Specialty:  Physician Assistant   Why:  Appointment is at 9:15   Contact information:   [place] [address] [phone]       Follow up with Dereck Leep,  MD On 07/16/2015.   Specialty:  Orthopedic Surgery   Why:  Appointment is at 10:45   Contact information:   [place] [id number] [phone]        Signed: Watt Climes. 06/05/2015, 8:01 AM

## 2015-06-05 NOTE — Progress Notes (Signed)
PT Cancellation Note  Patient Details Name: Steven Phelps MRN: [id number] DOB: 1931-02-24   Cancelled Treatment:    Reason Eval/Treat Not Completed: Other (comment) (Pt's O2 87% on room air (nursing notified and placed pt on supplemental O2 and respiratory coming for breathing treatment); will reattempt later today after breathing treatment.)   Leitha Bleak 06/05/2015, 2:58 PM Leitha Bleak, [place]
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
## 2015-06-05 NOTE — Care Management (Signed)
Important Message  Patient Details  Name: Steven Phelps MRN: [id number] Date of Birth: 1931/06/28   Medicare Important Message Given:  Yes-second notification given    Juliann Pulse A [place] 06/05/2015, 12:05 PM

## 2015-06-05 NOTE — Progress Notes (Signed)
Plan is for patient to D/C to [facility] tomorrow (Saturday 06/06/15). [facility] Medicare authorization has been received. Auth # [id number] RUA. Clinical Education officer, museum (CSW) sent D/C Summary to [facility] today via carefinder. Per [facility] admissions coordinator at [facility] patient is going to room 220-B. RN will call report at [phone]. Patient's daughter and son will provide transport to [facility] [place] in private vehicle. CSW will continue to follow and assist as needed.   Blima Rich, [place] [phone]

## 2015-06-06 LAB — CBC
HEMATOCRIT: 33.5 % — AB (ref 40.0–52.0)
HEMOGLOBIN: 11 g/dL — AB (ref 13.0–18.0)
MCH: 32 pg (ref 26.0–34.0)
MCHC: 32.9 g/dL (ref 32.0–36.0)
MCV: 97.3 fL (ref 80.0–100.0)
PLATELETS: 129 10*3/uL — AB (ref 150–440)
RBC: 3.44 MIL/uL — ABNORMAL LOW (ref 4.40–5.90)
RDW: 14.7 % — ABNORMAL HIGH (ref 11.5–14.5)
WBC: 5.4 10*3/uL (ref 3.8–10.6)

## 2015-06-06 LAB — GLUCOSE, CAPILLARY: Glucose-Capillary: 145 mg/dL — ABNORMAL HIGH (ref 65–99)

## 2015-06-06 NOTE — Progress Notes (Signed)
Patient is medically stable for D/C to [facility] today. Patient is going to room 220-B. RN will call report at [phone]. Patient's daughter Butch Penny will provide transport. Clinical Education officer, museum (CSW) contacted the Camera operator at [employer] who confirmed that patient can come today. CSW prepared D/C packet. CSW faxed D/C Summary to [facility] on Friday 06/05/15. [facility] Medicare authorization has been received. CSW left voicemail with patient's daughter Butch Penny and son Kasandra Knudsen making them aware of above. Patient is aware of above. Please reconsult if future social work needs arise. CSW signing off.   Blima Rich, [place] [phone]

## 2015-06-06 NOTE — Progress Notes (Signed)
Report called to [place] at [place]. Discharged to facility via family.

## 2015-06-06 NOTE — Progress Notes (Signed)
  Subjective: 3 Days Post-Op Procedure(s) (LRB): TOTAL KNEE ARTHROPLASTY (Left) Patient reports pain as mild.   Patient is well, and has had no acute complaints or problems Plan is to go Skilled nursing facility after hospital stay. [facility] facility Negative for chest pain and shortness of breath Fever: no Gastrointestinal:Negative for nausea and vomiting  Objective: Vital signs in last 24 hours: Temp:  [98.2 F (36.8 C)-98.7 F (37.1 C)] 98.4 F (36.9 C) (07/02 0735) Pulse Rate:  [79-109] 109 (07/02 0735) Resp:  [18] 18 (07/02 0735) BP: (105-151)/(59-90) 151/72 mmHg (07/02 0735) SpO2:  [85 %-98 %] 94 % (07/02 0735) FiO2 (%):  [28 %] 28 % (07/01 1458)  Intake/Output from previous day:  Intake/Output Summary (Last 24 hours) at 06/06/15 0828 Last data filed at 06/05/15 2055  Gross per 24 hour  Intake    240 ml  Output    525 ml  Net   -285 ml    Intake/Output this shift:    Labs:  Recent Labs  06/04/15 0437 06/05/15 0538 06/06/15 0339  HGB 11.5* 11.9* 11.0*    Recent Labs  06/05/15 0538 06/06/15 0339  WBC 5.7 5.4  RBC 3.75* 3.44*  HCT 36.1* 33.5*  PLT 123* 129*    Recent Labs  06/04/15 0437  NA 141  K 4.4  CL 107  CO2 28  BUN 15  CREATININE 1.09  GLUCOSE 145*  CALCIUM 8.5*   No results for input(s): LABPT, INR in the last 72 hours.   EXAM General - Patient is Alert, Appropriate and Oriented Extremity - Neurologically intact Neurovascular intact Dorsiflexion/Plantar flexion intact Incision: scant drainage Dressing/Incision - blood tinged drainage Motor Function - intact, moving foot and toes well on exam.   Past Medical History  Diagnosis Date  . Hypertension   . Heart murmur   . Shortness of breath dyspnea   . Diabetes mellitus without complication   . Arthritis   . Cancer     skin  . Glaucoma (increased eye pressure)     Assessment/Plan: 3 Days Post-Op Procedure(s) (LRB): TOTAL KNEE ARTHROPLASTY (Left) Active Problems:  Left knee DJD   Total knee replacement status  Estimated body mass index is 34.37 kg/(m^2) as calculated from the following:   Height as of this encounter: 5\' 8"  (1.727 m).   Weight as of this encounter: 102.513 kg (226 lb). Discharge to SNF   Will change dressing today before discharge. Pt was given MOM this morning and had a BM. Pt HR went up to 109 this morning, likely due to increased back pain while straining to have a BM.  Hr down to 79 at 10:02 AM. Will be discharged to [facility] facility.  DVT Prophylaxis - Lovenox, Foot Pumps and TED hose Weight-Bearing as tolerated to left leg  J. Cameron Proud, PA-C [facility] 06/06/2015, 8:28 AM

## 2015-06-06 NOTE — Clinical Social Work Placement (Signed)
   CLINICAL SOCIAL WORK PLACEMENT  NOTE  Date:  06/06/2015  Patient Details  Name: Steven Phelps MRN: [id number] Date of Birth: 1930/12/21  Clinical Social Work is seeking post-discharge placement for this patient at the [place] level of care (*CSW will initial, date and re-position this form in  chart as items are completed):  Yes   Patient/family provided with [facility] Work Department's list of facilities offering this level of care within the geographic area requested by the patient (or if unable, by the patient's family).  Yes   Patient/family informed of their freedom to choose among providers that offer the needed level of care, that participate in Medicare, Medicaid or managed care program needed by the patient, have an available bed and are willing to accept the patient.  Yes   Patient/family informed of [facility]'s ownership interest in [facility] and [facility], as well as of the fact that they are under no obligation to receive care at these facilities.  PASRR submitted to EDS on 06/04/15     PASRR number received on 06/04/15     Existing PASRR number confirmed on       FL2 transmitted to all facilities in geographic area requested by pt/family on 06/05/15     FL2 transmitted to all facilities within larger geographic area on       Patient informed that his/her managed care company has contracts with or will negotiate with certain facilities, including the following:        Yes   Patient/family informed of bed offers received.  Patient chooses bed at  [facility] )     Physician recommends and patient chooses bed at      Patient to be transferred to  [facility] ) on 06/06/15.  Patient to be transferred to facility by  (Daughter in private vehicle. )     Patient family notified on 06/06/15 of transfer.  Name of family member notified:   (Vociemail was left with patient's son and daughter. )     PHYSICIAN        Additional Comment:    _______________________________________________ Loralyn Freshwater, LCSW 06/06/2015, 9:57 AM

## 2015-06-06 NOTE — Progress Notes (Signed)
Patient taking po pain meds for c/o back pain.resting well this shift. Needs BM will give MOM. Patient plans to go to [facility] today

## 2015-06-06 NOTE — Progress Notes (Signed)
Physical Therapy Treatment Patient Details Name: Steven Phelps MRN: [id number] DOB: [birth year] Today's Date: 06/06/2015    History of Present Illness Pt is an 81 y.o. male s/p L TKA 06/03/15.    PT Comments    Pt was seen for last visit to precede DC to SNF.  He is doing well and is going to work toward more independent balance and gait skills, as well as increasing his ROM to L knee to increase his tolerance for sit control.  Declined to do greater distance with gait due to his fatigue with activity, and is still coughing from congestion.  Follow Up Recommendations  SNF     Equipment Recommendations  Rolling walker with 5" wheels    Recommendations for Other Services       Precautions / Restrictions Precautions Precautions: Fall;Knee Precaution Booklet Issued: Yes (comment) Restrictions Weight Bearing Restrictions: Yes LLE Weight Bearing: Weight bearing as tolerated    Mobility  Bed Mobility Overal bed mobility: Needs Assistance Bed Mobility: Supine to Sit;Sit to Supine     Supine to sit: Supervision;Min guard Sit to supine: Supervision;Min guard;HOB elevated      Transfers Overall transfer level: Needs assistance Equipment used: Rolling walker (2 wheeled) Transfers: Sit to/from [facility] Sit to Stand: Min guard;Min assist Stand pivot transfers: Min guard       General transfer comment: reminders for hand placement at transitions  Ambulation/Gait Ambulation/Gait assistance: Min guard Ambulation Distance (Feet): 80 Feet Assistive device: Rolling walker (2 wheeled) Gait Pattern/deviations: Step-to pattern;Decreased stance time - left;Decreased stride length;Wide base of support;Trunk flexed Gait velocity: reduced Gait velocity interpretation: Below normal speed for age/gender General Gait Details: step to pattern and tends to get too close to front of walker, reminded sequence   Stairs            Wheelchair Mobility    Modified  Rankin (Stroke Patients Only)       Balance           Standing balance support: Bilateral upper extremity supported Standing balance-Leahy Scale: Fair                      Cognition Arousal/Alertness: Awake/alert Behavior During Therapy: WFL for tasks assessed/performed Overall Cognitive Status: Within Functional Limits for tasks assessed                      Exercises Total Joint Exercises Ankle Circles/Pumps: AROM;Both;5 reps Quad Sets: AROM;Both;10 reps Gluteal Sets: AROM;Both;10 reps Heel Slides: AROM;Both;10 reps Straight Leg Raises: AAROM;Left;10 reps Long Arc Quad: AROM;Left;5 reps Goniometric ROM: Flexion to 90 deg today    General Comments General comments (skin integrity, edema, etc.): Better control of L knee with ROM increased to 90 deg flexion and better able to control descent to sit      Pertinent Vitals/Pain Pain Assessment: 0-10 Pain Score: 3  Pain Intervention(s): Limited activity within patient's tolerance;Premedicated before session    Home Living                      Prior Function            PT Goals (current goals can now be found in the care plan section) Acute Rehab PT Goals Patient Stated Goal: To go to rehab Progress towards PT goals: Progressing toward goals    Frequency  BID    PT Plan Current plan remains appropriate    Co-evaluation  End of Session   Activity Tolerance: Patient tolerated treatment well Patient left: in bed;with call bell/phone within reach;with bed alarm set     Time: 2449-7530 PT Time Calculation (min) (ACUTE ONLY): 41 min  Charges:  $Gait Training: 8-22 mins $Therapeutic Exercise: 8-22 mins $Therapeutic Activity: 8-22 mins                    G Codes:      Ramond Dial Jun 15, 2015, 12:00 PM   Mee Hives, PT MS Acute Rehab Dept. Number: [facility] [phone] and [place] [phone]

## 2015-11-22 ENCOUNTER — Inpatient Hospital Stay
Admission: EM | Admit: 2015-11-22 | Discharge: 2015-11-24 | DRG: 190 | Disposition: A | Discharge status: Home Health | Payer: Medicare Other | Attending: Internal Medicine | Admitting: Internal Medicine

## 2015-11-22 ENCOUNTER — Emergency Department: Payer: Medicare Other

## 2015-11-22 DIAGNOSIS — Z87891 Personal history of nicotine dependence: Secondary | ICD-10-CM | POA: Diagnosis not present

## 2015-11-22 DIAGNOSIS — J440 Chronic obstructive pulmonary disease with acute lower respiratory infection: Secondary | ICD-10-CM | POA: Diagnosis not present

## 2015-11-22 DIAGNOSIS — E785 Hyperlipidemia, unspecified: Secondary | ICD-10-CM | POA: Diagnosis present

## 2015-11-22 DIAGNOSIS — J9601 Acute respiratory failure with hypoxia: Secondary | ICD-10-CM | POA: Diagnosis present

## 2015-11-22 DIAGNOSIS — Z952 Presence of prosthetic heart valve: Secondary | ICD-10-CM

## 2015-11-22 DIAGNOSIS — I10 Essential (primary) hypertension: Secondary | ICD-10-CM | POA: Diagnosis present

## 2015-11-22 DIAGNOSIS — E119 Type 2 diabetes mellitus without complications: Secondary | ICD-10-CM | POA: Diagnosis present

## 2015-11-22 DIAGNOSIS — J189 Pneumonia, unspecified organism: Secondary | ICD-10-CM | POA: Diagnosis not present

## 2015-11-22 DIAGNOSIS — I4891 Unspecified atrial fibrillation: Secondary | ICD-10-CM | POA: Diagnosis present

## 2015-11-22 DIAGNOSIS — R600 Localized edema: Secondary | ICD-10-CM | POA: Diagnosis present

## 2015-11-22 DIAGNOSIS — Z7982 Long term (current) use of aspirin: Secondary | ICD-10-CM

## 2015-11-22 DIAGNOSIS — Z79899 Other long term (current) drug therapy: Secondary | ICD-10-CM

## 2015-11-22 DIAGNOSIS — Z888 Allergy status to other drugs, medicaments and biological substances status: Secondary | ICD-10-CM | POA: Diagnosis not present

## 2015-11-22 DIAGNOSIS — Z794 Long term (current) use of insulin: Secondary | ICD-10-CM

## 2015-11-22 HISTORY — DX: Unspecified atrial fibrillation: I48.91

## 2015-11-22 HISTORY — DX: Hyperlipidemia, unspecified: E78.5

## 2015-11-22 LAB — CBC WITH DIFFERENTIAL/PLATELET
BASOS PCT: 0 %
Basophils Absolute: 0 10*3/uL (ref 0–0.1)
Eosinophils Absolute: 0 10*3/uL (ref 0–0.7)
Eosinophils Relative: 0 %
HEMATOCRIT: 36.8 % — AB (ref 40.0–52.0)
HEMOGLOBIN: 12 g/dL — AB (ref 13.0–18.0)
LYMPHS PCT: 11 %
Lymphs Abs: 0.7 10*3/uL — ABNORMAL LOW (ref 1.0–3.6)
MCH: 31.6 pg (ref 26.0–34.0)
MCHC: 32.7 g/dL (ref 32.0–36.0)
MCV: 96.5 fL (ref 80.0–100.0)
MONO ABS: 0.8 10*3/uL (ref 0.2–1.0)
MONOS PCT: 13 %
NEUTROS ABS: 4.9 10*3/uL (ref 1.4–6.5)
NEUTROS PCT: 76 %
Platelets: 176 10*3/uL (ref 150–440)
RBC: 3.81 MIL/uL — ABNORMAL LOW (ref 4.40–5.90)
RDW: 14.4 % (ref 11.5–14.5)
WBC: 6.6 10*3/uL (ref 3.8–10.6)

## 2015-11-22 LAB — COMPREHENSIVE METABOLIC PANEL
ALBUMIN: 3.4 g/dL — AB (ref 3.5–5.0)
ALT: 50 U/L (ref 17–63)
ANION GAP: 8 (ref 5–15)
AST: 63 U/L — AB (ref 15–41)
Alkaline Phosphatase: 97 U/L (ref 38–126)
BUN: 31 mg/dL — AB (ref 6–20)
CHLORIDE: 105 mmol/L (ref 101–111)
CO2: 31 mmol/L (ref 22–32)
Calcium: 9.7 mg/dL (ref 8.9–10.3)
Creatinine, Ser: 1.27 mg/dL — ABNORMAL HIGH (ref 0.61–1.24)
GFR calc Af Amer: 59 mL/min — ABNORMAL LOW (ref >60)
GFR calc non Af Amer: 51 mL/min — ABNORMAL LOW (ref >60)
GLUCOSE: 211 mg/dL — AB (ref 65–99)
POTASSIUM: 4 mmol/L (ref 3.5–5.1)
SODIUM: 144 mmol/L (ref 135–145)
TOTAL PROTEIN: 7.5 g/dL (ref 6.5–8.1)
Total Bilirubin: 0.9 mg/dL (ref 0.3–1.2)

## 2015-11-22 LAB — GLUCOSE, CAPILLARY
GLUCOSE-CAPILLARY: 193 mg/dL — AB (ref 65–99)
Glucose-Capillary: 210 mg/dL — ABNORMAL HIGH (ref 65–99)

## 2015-11-22 LAB — TROPONIN I: Troponin I: <0.03 ng/mL (ref <0.031)

## 2015-11-22 LAB — URINALYSIS COMPLETE WITH MICROSCOPIC (ARMC ONLY)
BACTERIA UA: NONE SEEN
Bilirubin Urine: NEGATIVE
Glucose, UA: NEGATIVE mg/dL
HGB URINE DIPSTICK: NEGATIVE
KETONES UR: NEGATIVE mg/dL
LEUKOCYTES UA: NEGATIVE
NITRITE: NEGATIVE
PH: 5 (ref 5.0–8.0)
Protein, ur: NEGATIVE mg/dL
SPECIFIC GRAVITY, URINE: 1.028 (ref 1.005–1.030)
SQUAMOUS EPITHELIAL / LPF: NONE SEEN

## 2015-11-22 MED ORDER — ACETAMINOPHEN 650 MG RE SUPP: 650.0000 mg | Freq: Four times a day (QID) | RECTAL | Status: DC | PRN

## 2015-11-22 MED ORDER — ALBUTEROL SULFATE (2.5 MG/3ML) 0.083% IN NEBU: 2.5000 mg | INHALATION_SOLUTION | Freq: Four times a day (QID) | RESPIRATORY_TRACT | Status: DC | PRN

## 2015-11-22 MED ORDER — INSULIN GLARGINE 100 UNIT/ML ~~LOC~~ SOLN
15.0000 [IU] | Freq: Every day | SUBCUTANEOUS | Status: DC
  Administered 2015-11-22 – 2015-11-23 (×2): 15 [IU] via SUBCUTANEOUS
  Filled 2015-11-22 (×3): qty 0.15

## 2015-11-22 MED ORDER — SODIUM CHLORIDE 0.9 % IV SOLN
INTRAVENOUS | Status: DC
  Administered 2015-11-22: 19:00:00 via INTRAVENOUS

## 2015-11-22 MED ORDER — METOPROLOL TARTRATE 100 MG PO TABS
100.0000 mg | ORAL_TABLET | Freq: Two times a day (BID) | ORAL | Status: DC
  Administered 2015-11-22 – 2015-11-24 (×4): 100 mg via ORAL
  Filled 2015-11-22 (×4): qty 1

## 2015-11-22 MED ORDER — LEVOFLOXACIN IN D5W 750 MG/150ML IV SOLN
750.0000 mg | INTRAVENOUS | Status: DC
  Administered 2015-11-23: 750 mg via INTRAVENOUS
  Filled 2015-11-22 (×2): qty 150

## 2015-11-22 MED ORDER — OXYCODONE HCL 5 MG PO TABS: 5.0000 mg | ORAL_TABLET | ORAL | Status: DC | PRN

## 2015-11-22 MED ORDER — ONDANSETRON HCL 4 MG/2ML IJ SOLN: 4.0000 mg | Freq: Four times a day (QID) | INTRAMUSCULAR | Status: DC | PRN

## 2015-11-22 MED ORDER — SODIUM CHLORIDE 0.9 % IJ SOLN
3.0000 mL | Freq: Two times a day (BID) | INTRAMUSCULAR | Status: DC
Start: 2015-11-22 — End: 2015-11-24
  Administered 2015-11-22 – 2015-11-24 (×4): 3 mL via INTRAVENOUS

## 2015-11-22 MED ORDER — INSULIN GLARGINE 100 UNITS/ML SOLOSTAR PEN: 15.0000 [IU] | PEN_INJECTOR | Freq: Every day | SUBCUTANEOUS | Status: DC

## 2015-11-22 MED ORDER — LOSARTAN POTASSIUM 50 MG PO TABS
50.0000 mg | ORAL_TABLET | Freq: Two times a day (BID) | ORAL | Status: DC
  Administered 2015-11-22 – 2015-11-24 (×4): 50 mg via ORAL
  Filled 2015-11-22 (×4): qty 1

## 2015-11-22 MED ORDER — IPRATROPIUM-ALBUTEROL 0.5-2.5 (3) MG/3ML IN SOLN
3.0000 mL | Freq: Once | RESPIRATORY_TRACT | Status: AC
  Administered 2015-11-22: 3 mL via RESPIRATORY_TRACT
  Filled 2015-11-22: qty 3

## 2015-11-22 MED ORDER — IOHEXOL 350 MG/ML SOLN
75.0000 mL | Freq: Once | INTRAVENOUS | Status: AC | PRN
  Administered 2015-11-22: 75 mL via INTRAVENOUS

## 2015-11-22 MED ORDER — ACETAMINOPHEN 325 MG PO TABS: 650.0000 mg | ORAL_TABLET | Freq: Four times a day (QID) | ORAL | Status: DC | PRN

## 2015-11-22 MED ORDER — AMLODIPINE BESYLATE 5 MG PO TABS
5.0000 mg | ORAL_TABLET | Freq: Every day | ORAL | Status: DC
Start: 2015-11-23 — End: 2015-11-24
  Administered 2015-11-23: 5 mg via ORAL
  Filled 2015-11-22: qty 1

## 2015-11-22 MED ORDER — ENOXAPARIN SODIUM 40 MG/0.4ML ~~LOC~~ SOLN
40.0000 mg | SUBCUTANEOUS | Status: DC
Start: 2015-11-22 — End: 2015-11-24
  Administered 2015-11-22 – 2015-11-23 (×2): 40 mg via SUBCUTANEOUS
  Filled 2015-11-22 (×2): qty 0.4

## 2015-11-22 MED ORDER — ONDANSETRON HCL 4 MG PO TABS: 4.0000 mg | ORAL_TABLET | Freq: Four times a day (QID) | ORAL | Status: DC | PRN

## 2015-11-22 MED ORDER — INSULIN ASPART 100 UNIT/ML ~~LOC~~ SOLN: 0.0000 [IU] | Freq: Every day | SUBCUTANEOUS | Status: DC

## 2015-11-22 MED ORDER — ATORVASTATIN CALCIUM 20 MG PO TABS
40.0000 mg | ORAL_TABLET | Freq: Every day | ORAL | Status: DC
  Administered 2015-11-23: 40 mg via ORAL
  Filled 2015-11-22: qty 2

## 2015-11-22 MED ORDER — INSULIN ASPART 100 UNIT/ML ~~LOC~~ SOLN
0.0000 [IU] | Freq: Three times a day (TID) | SUBCUTANEOUS | Status: DC
  Administered 2015-11-23 (×3): 2 [IU] via SUBCUTANEOUS
  Administered 2015-11-24: 1 [IU] via SUBCUTANEOUS
  Administered 2015-11-24: 2 [IU] via SUBCUTANEOUS
  Filled 2015-11-22 (×2): qty 2
  Filled 2015-11-22: qty 1
  Filled 2015-11-22 (×2): qty 2

## 2015-11-22 MED ORDER — FUROSEMIDE 20 MG PO TABS
20.0000 mg | ORAL_TABLET | Freq: Every day | ORAL | Status: DC
  Administered 2015-11-23 – 2015-11-24 (×2): 20 mg via ORAL
  Filled 2015-11-22 (×2): qty 1

## 2015-11-22 MED ORDER — ASPIRIN 81 MG PO TABS: 81.0000 mg | ORAL_TABLET | Freq: Every day | ORAL | Status: DC

## 2015-11-22 MED ORDER — LEVOFLOXACIN IN D5W 750 MG/150ML IV SOLN
750.0000 mg | Freq: Once | INTRAVENOUS | Status: AC
  Administered 2015-11-22: 750 mg via INTRAVENOUS
  Filled 2015-11-22: qty 150

## 2015-11-22 MED ORDER — ASPIRIN EC 81 MG PO TBEC
81.0000 mg | DELAYED_RELEASE_TABLET | Freq: Every day | ORAL | Status: DC
  Administered 2015-11-22 – 2015-11-24 (×3): 81 mg via ORAL
  Filled 2015-11-22 (×3): qty 1

## 2015-11-22 MED ORDER — IPRATROPIUM-ALBUTEROL 0.5-2.5 (3) MG/3ML IN SOLN: 3.0000 mL | RESPIRATORY_TRACT | Status: DC | PRN

## 2015-11-22 MED ORDER — ALBUTEROL SULFATE HFA 108 (90 BASE) MCG/ACT IN AERS: 2.0000 | INHALATION_SPRAY | Freq: Four times a day (QID) | RESPIRATORY_TRACT | Status: DC | PRN

## 2015-11-22 MED ORDER — LATANOPROST 0.005 % OP SOLN
1.0000 [drp] | Freq: Every day | OPHTHALMIC | Status: DC
  Administered 2015-11-22 – 2015-11-23 (×2): 1 [drp] via OPHTHALMIC
  Filled 2015-11-22: qty 2.5

## 2015-11-22 NOTE — H&P (Signed)
[facility] [place] NAME: Steven Phelps    MR#:  [id number]  DATE OF BIRTH:  [birth year]  DATE OF ADMISSION:  11/22/2015  PRIMARY CARE PHYSICIAN: Juluis Pitch, MD   REQUESTING/REFERRING PHYSICIAN: Kerman Passey, MD  CHIEF COMPLAINT:   Chief Complaint  Patient presents with  . Shortness of Breath    HISTORY OF PRESENT ILLNESS:  Steven Phelps  is a 81 y.o. male who presents with progressive shortness of breath and cough. Patient states that he has been on outpatient antibiotics for the past several days for cough and increasing shortness of breath. However, he states that despite taking these oral antibiotics he was not improving. He went back to urgent care today to be reevaluated, and his O2 sats were noted to be 78% on room air. He was brought to the ED for further evaluation. Here he was found to be hypoxic, but responsive to oxygen via nasal cannula. He eventually had a CT of his chest which showed a left lower lobe pneumonia. IV antibiotics were started, and hospitalists were called for admission.  PAST MEDICAL HISTORY:   Past Medical History  Diagnosis Date  . Hypertension   . Heart murmur   . Shortness of breath dyspnea   . Diabetes mellitus without complication ([place])   . Arthritis   . Cancer ([place])     skin  . Glaucoma (increased eye pressure)   . HLD (hyperlipidemia)   . Atrial fibrillation ([place])     PAST SURGICAL HISTORY:   Past Surgical History  Procedure Laterality Date  . Tonsillectomy    . Eye surgery Bilateral     cataract extraction  . Cholecystectomy    . Aortic valve replacement  2010  . Cardiac catheterization    . Total knee arthroplasty Left 06/03/2015    Procedure: TOTAL KNEE ARTHROPLASTY;  Surgeon: Dereck Leep, MD;  Location: [facility] ORS;  Service: Orthopedics;  Laterality: Left;    SOCIAL HISTORY:   Social History  Substance Use Topics  . Smoking status: Former Smoker -- 0.50 packs/day     Types: Cigarettes  . Smokeless tobacco: Never Used  . Alcohol Use: Yes     Comment: 3 drinks a day    FAMILY HISTORY:   Family History  Problem Relation Age of Onset  . Diabetes Father     DRUG ALLERGIES:   Allergies  Allergen Reactions  . Synvisc [Hylan G-F 20] Other (See Comments)    "severe pain in knee"  . Zocor [Simvastatin] Other (See Comments)    "muscle aches in joints"    MEDICATIONS AT HOME:   Prior to Admission medications   Medication Sig Start Date End Date Taking? Authorizing Provider  albuterol (PROVENTIL HFA;VENTOLIN HFA) 108 (90 BASE) MCG/ACT inhaler Inhale 2 puffs into the lungs every 6 (six) hours as needed for wheezing or shortness of breath.    Historical Provider, MD  amLODipine (NORVASC) 5 MG tablet Take 5 mg by mouth daily at 6 PM.    Historical Provider, MD  amoxicillin (AMOXIL) 500 MG capsule Take 500 mg by mouth once. 4 capsules one hour prior to dental appointment    Historical Provider, MD  aspirin 81 MG tablet Take 1 tablet (81 mg total) by mouth daily. 06/20/15   Watt Climes, PA  atorvastatin (LIPITOR) 40 MG tablet Take 40 mg by mouth daily at 6 PM.    Historical Provider, MD  Doxycycline Hyclate 50 MG TABS Take 50  mg by mouth as needed (for flares).    Historical Provider, MD  enoxaparin (LOVENOX) 40 MG/0.4ML injection Inject 0.4 mLs (40 mg total) into the skin daily. 06/04/15   Watt Climes, PA  folic acid (FOLVITE) Q000111Q MCG tablet Take 800 mcg by mouth daily.    Historical Provider, MD  furosemide (LASIX) 20 MG tablet Take 20 mg by mouth daily.    Historical Provider, MD  glipiZIDE (GLUCOTROL) 10 MG tablet Take 10 mg by mouth 2 (two) times daily before a meal.    Historical Provider, MD  insulin glargine (LANTUS) 100 unit/mL SOPN Inject 25 Units into the skin at bedtime.    Historical Provider, MD  latanoprost (XALATAN) 0.005 % ophthalmic solution Place 1 drop into both eyes at bedtime.    Historical Provider, MD  losartan (COZAAR) 50 MG tablet  Take 50 mg by mouth 2 (two) times daily.    Historical Provider, MD  metoprolol (LOPRESSOR) 100 MG tablet Take 100 mg by mouth 2 (two) times daily.    Historical Provider, MD  metroNIDAZOLE (METROGEL) 1 % gel Apply 1 application topically daily.    Historical Provider, MD  mometasone (NASONEX) 50 MCG/ACT nasal spray Place 2 sprays into the nose as needed.    Historical Provider, MD  Multiple Vitamins-Minerals (CENTRUM SILVER PO) Take 1 tablet by mouth daily.    Historical Provider, MD  oxyCODONE (OXY IR/ROXICODONE) 5 MG immediate release tablet Take 1-2 tablets (5-10 mg total) by mouth every 4 (four) hours as needed for breakthrough pain. 06/04/15   Watt Climes, PA  tadalafil (CIALIS) 5 MG tablet Take 5 mg by mouth daily as needed for erectile dysfunction.    Historical Provider, MD  traMADol (ULTRAM) 50 MG tablet Take 1-2 tablets (50-100 mg total) by mouth every 4 (four) hours as needed for moderate pain. 06/04/15   Watt Climes, PA  vitamin C (ASCORBIC ACID) 500 MG tablet Take 500 mg by mouth daily.    Historical Provider, MD  vitamin E 400 UNIT capsule Take 400 Units by mouth daily.    Historical Provider, MD    REVIEW OF SYSTEMS:  Review of Systems  Constitutional: Positive for malaise/fatigue. Negative for fever, chills and weight loss.  HENT: Negative for ear pain, hearing loss and tinnitus.   Eyes: Negative for blurred vision, double vision, pain and redness.  Respiratory: Positive for cough, sputum production and shortness of breath. Negative for hemoptysis.   Cardiovascular: Negative for chest pain, palpitations, orthopnea and leg swelling.  Gastrointestinal: Negative for nausea, vomiting, abdominal pain, diarrhea and constipation.  Genitourinary: Negative for dysuria, frequency and hematuria.  Musculoskeletal: Negative for back pain, joint pain and neck pain.  Skin:       No acne, rash, or lesions  Neurological: Positive for weakness. Negative for dizziness, tremors and focal weakness.   Endo/Heme/Allergies: Negative for polydipsia. Does not bruise/bleed easily.  Psychiatric/Behavioral: Negative for depression. The patient is not nervous/anxious and does not have insomnia.      VITAL SIGNS:   Filed Vitals:   11/22/15 1235 11/22/15 1400 11/22/15 1430 11/22/15 1630  BP: 153/88 155/72 147/57 138/69  Pulse: 108 98 93 97  Temp: 98.7 F (37.1 C)     TempSrc: Oral     Resp: 20 23 24 28   Height: 5\' 8"  (1.727 m)     Weight: 102.059 kg (225 lb)     SpO2: 93% 91% 93% 91%   Wt Readings from Last 3 Encounters:  11/22/15  102.059 kg (225 lb)  06/03/15 102.513 kg (226 lb)  05/20/15 99.791 kg (220 lb)    PHYSICAL EXAMINATION:  Physical Exam  Vitals reviewed. Constitutional: He is oriented to person, place, and time. He appears well-developed and well-nourished. No distress.  HENT:  Head: Normocephalic and atraumatic.  Mouth/Throat: Oropharynx is clear and moist.  Eyes: Conjunctivae and EOM are normal. Pupils are equal, round, and reactive to light. No scleral icterus.  Neck: Normal range of motion. Neck supple. No JVD present. No thyromegaly present.  Cardiovascular: Normal rate, regular rhythm and intact distal pulses.  Exam reveals no gallop and no friction rub.   No murmur heard. Respiratory: He is in respiratory distress (mild). He has wheezes (Bibasilar). He has rales (Left basilar).  GI: Soft. Bowel sounds are normal. He exhibits no distension. There is no tenderness.  Musculoskeletal: Normal range of motion. He exhibits no edema.  No arthritis, no gout  Lymphadenopathy:    He has no cervical adenopathy.  Neurological: He is alert and oriented to person, place, and time. No cranial nerve deficit.  No dysarthria, no aphasia  Skin: Skin is warm and dry. No rash noted. No erythema.  Psychiatric: He has a normal mood and affect. His behavior is normal. Judgment and thought content normal.    LABORATORY PANEL:   CBC  Recent Labs Lab 11/22/15 1247  WBC 6.6   HGB 12.0*  HCT 36.8*  PLT 176   ------------------------------------------------------------------------------------------------------------------  Chemistries   Recent Labs Lab 11/22/15 1247  NA 144  K 4.0  CL 105  CO2 31  GLUCOSE 211*  BUN 31*  CREATININE 1.27*  CALCIUM 9.7  AST 63*  ALT 50  ALKPHOS 97  BILITOT 0.9   ------------------------------------------------------------------------------------------------------------------  Cardiac Enzymes  Recent Labs Lab 11/22/15 1247  TROPONINI <0.03   ------------------------------------------------------------------------------------------------------------------  RADIOLOGY:  Ct Angio Chest Pe W/cm &/or Wo Cm  11/22/2015  CLINICAL DATA:  Shortness of breath with hypoxemia and nonproductive cough. Hyperglycemia. Evaluate for pulmonary embolism. EXAM: CT ANGIOGRAPHY CHEST WITH CONTRAST TECHNIQUE: Multidetector CT imaging of the chest was performed using the standard protocol during bolus administration of intravenous contrast. Multiplanar CT image reconstructions and MIPs were obtained to evaluate the vascular anatomy. CONTRAST:  76mL OMNIPAQUE IOHEXOL 350 MG/ML SOLN COMPARISON:  Radiograph same date and 11/14/2011. FINDINGS: Mediastinum: The pulmonary arteries are suboptimally opacified with contrast. There is no evidence of acute pulmonary embolism. There is diffuse atherosclerosis of the aorta, great vessels and coronary arteries status post median sternotomy and aortic valve replacement. Small subcarinal and hilar lymph nodes are not pathologically enlarged. The thyroid gland, trachea and esophagus demonstrate no significant findings. Lungs/Pleura: There is no pleural effusion.Mild emphysematous changes and diffuse central airway thickening are noted. There is focal airspace disease medially in the left lower lobe most consistent with pneumonia, possibly on the basis of aspiration. Upper abdomen: No suspicious findings within  the visualized upper abdomen. Diffuse atherosclerosis noted. Musculoskeletal/Chest wall: No chest wall lesion or acute osseous findings.Extensive flowing osteophytes throughout the thoracic spine consistent with diffuse idiopathic skeletal hyperostosis. Review of the MIP images confirms the above findings. IMPRESSION: 1. No evidence of acute pulmonary embolism. 2. Left lower lobe pneumonia. Diffuse central airway thickening superimposed on emphysema. Followup PA and lateral chest X-ray is recommended in 3-4 weeks following trial of antibiotic therapy to ensure resolution and exclude underlying malignancy. 3. Diffuse atherosclerosis. Electronically Signed   By: Richardean Sale M.D.   On: 11/22/2015 15:56   Dg Chest Portable  1 View  11/22/2015  CLINICAL DATA:  Cough and shortness of breath for 1 week, nonsmoker, atrial valve replacement in 2010. EXAM: PORTABLE CHEST 1 VIEW COMPARISON:  Chest x-ray dated 11/14/2011. FINDINGS: Heart size is normal. Overall cardiomediastinal silhouette is stable in size and configuration. Median sternotomy wires appear intact and stable in alignment. Subtle opacities at each lung base are most likely atelectasis. Lungs are otherwise clear. Lung volumes are normal. No evidence of pneumonia. No pleural effusions seen. Degenerative changes noted at the right shoulder joint, at least moderate in degree. No acute-appearing osseous abnormality seen. IMPRESSION: Probable mild bibasilar atelectasis. Lungs otherwise clear. No evidence of pneumonia seen. Electronically Signed   By: Franki Cabot M.D.   On: 11/22/2015 13:26    EKG:   Orders placed or performed during the hospital encounter of 05/20/15  . EKG 12-Lead  . EKG 12-Lead    IMPRESSION AND PLAN:  Principal Problem:   Acute respiratory failure with hypoxia (HCC) - O2 sats responsive to oxygen via nasal cannula however, he is requiring 6 L at this time. Despite this, he is not working excessively to breathe, and is able to  speak clearly without too much difficulty. He did not feel that he needs mechanical ventilation at this time. We will admit him, treat his pneumonia as below, and wean his oxygen as tolerated to keep his O2 sats above 90%. Active Problems:   CAP (community acquired pneumonia) - IV antibiotics started in the ED, continue these on admission. Blood culture sent, sputum culture also ordered. Nebs when necessary. Supplemental oxygen as above.   Type 2 diabetes mellitus (HCC) - carb modified diet with sliding scale insulin and glucose checks.   HTN (hypertension) - continue home meds   HLD (hyperlipidemia) - continue home meds   A-fib ([place]) - not currently in A. fib, continue home rate controlling medications  All the records are reviewed and case discussed with ED provider. Management plans discussed with the patient and/or family.  DVT PROPHYLAXIS: SubQ lovenox  GI PROPHYLAXIS: None  ADMISSION STATUS: Inpatient  CODE STATUS: Full  TOTAL TIME TAKING CARE OF THIS PATIENT: 45 minutes.    Amayrany Cafaro FIELDING 11/22/2015, 5:12 PM  [facility]  Office  [phone]  CC: Primary care physician; Juluis Pitch, MD

## 2015-11-22 NOTE — Progress Notes (Signed)
ANTIBIOTIC CONSULT NOTE - INITIAL  Pharmacy Consult for levofloxacin Indication: pneumonia  Allergies  Allergen Reactions  . Synvisc [Hylan G-F 20] Other (See Comments)    "severe pain in knee"  . Zocor [Simvastatin] Other (See Comments)    "muscle aches in joints"    Patient Measurements: Height: 5\' 8"  (172.7 cm) Weight: 225 lb (102.059 kg) IBW/kg (Calculated) : 68.4  Vital Signs: Temp: 97.6 F (36.4 C) (12/18 1833) Temp Source: Oral (12/18 1833) BP: 158/70 mmHg (12/18 1833) Pulse Rate: 103 (12/18 1833) Intake/Output from previous day:   Intake/Output from this shift:    Labs:  Recent Labs  11/22/15 1247  WBC 6.6  HGB 12.0*  PLT 176  CREATININE 1.27*   Estimated Creatinine Clearance: 52.8 mL/min (by C-G formula based on Cr of 1.27). No results for input(s): VANCOTROUGH, VANCOPEAK, VANCORANDOM, GENTTROUGH, GENTPEAK, GENTRANDOM, TOBRATROUGH, TOBRAPEAK, TOBRARND, AMIKACINPEAK, AMIKACINTROU, AMIKACIN in the last 72 hours.   Microbiology: No results found for this or any previous visit (from the past 720 hour(s)).  Assessment: Pharmacy consulted to dose levofloxacin in this 81 year old male for CAP.  CrCl ~ 53 mL/min  Plan:  Follow up culture results  Start levofloxacin 750 mg IV daily per renal function and indication.  Darylene Price Ashanty Coltrane 11/22/2015,7:08 PM

## 2015-11-22 NOTE — ED Provider Notes (Signed)
[facility] Emergency Department Provider Note  Time seen: 12:44 PM  I have reviewed the triage vital signs and the nursing notes.   HISTORY  Chief Complaint Shortness of Breath    HPI Steven Phelps is a 81 y.o. male with a past medical history of hypertension, diabetes, status post aortic valve replacement, peripheral edema on Lasix, who presents the emergency department with cough and congestion along with dyspnea. According to the patient for almost the past one week he has had cough and congestion. He went to his primary care doctor 6 days ago and was prescribed over-the-counter medications denies any antibiotic usage. He states over the past 6 days has cough and congestion has continued to worsen and now over the past several days he has felt very short of breath worse with exertion. Denies fever. Denies chest pain. States mild lower extremity swelling which is normal for him.Patient was seen in urgent care but sent to the emergency department due to a room air oxygen saturation 78%. Patient has no known COPD or asthma, does not wear oxygen at home.     Past Medical History  Diagnosis Date  . Hypertension   . Heart murmur   . Shortness of breath dyspnea   . Diabetes mellitus without complication ([place])   . Arthritis   . Cancer ([place])     skin  . Glaucoma (increased eye pressure)     Patient Active Problem List   Diagnosis Date Noted  . 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 Total knee replacement status 06/03/2015    Past Surgical History  Procedure Laterality Date  . Tonsillectomy    . Eye surgery Bilateral     cataract extraction  . Cholecystectomy    . Aortic valve replacement  2010  . Cardiac catheterization    . Total knee arthroplasty Left 06/03/2015    Procedure: TOTAL KNEE ARTHROPLASTY;  Surgeon: Dereck Leep, MD;  Location: [facility] ORS;  Service: Orthopedics;  Laterality: Left;    Current Outpatient Rx  Name  Route  Sig  Dispense  Refill  .  albuterol (PROVENTIL HFA;VENTOLIN HFA) 108 (90 BASE) MCG/ACT inhaler   Inhalation   Inhale 2 puffs into the lungs every 6 (six) hours as needed for wheezing or shortness of breath.         Marland Kitchen amLODipine (NORVASC) 5 MG tablet   Oral   Take 5 mg by mouth daily at 6 PM.         . amoxicillin (AMOXIL) 500 MG capsule   Oral   Take 500 mg by mouth once. 4 capsules one hour prior to dental appointment         . aspirin 81 MG tablet   Oral   Take 1 tablet (81 mg total) by mouth daily.   1 tablet   0   . atorvastatin (LIPITOR) 40 MG tablet   Oral   Take 40 mg by mouth daily at 6 PM.         . Doxycycline Hyclate 50 MG TABS   Oral   Take 50 mg by mouth as needed (for flares).         . enoxaparin (LOVENOX) 40 MG/0.4ML injection   Subcutaneous   Inject 0.4 mLs (40 mg total) into the skin daily.   14 Syringe   0   . folic acid (FOLVITE) Q000111Q MCG tablet   Oral   Take 800 mcg by mouth daily.         Marland Kitchen  furosemide (LASIX) 20 MG tablet   Oral   Take 20 mg by mouth daily.         Marland Kitchen glipiZIDE (GLUCOTROL) 10 MG tablet   Oral   Take 10 mg by mouth 2 (two) times daily before a meal.         . insulin glargine (LANTUS) 100 unit/mL SOPN   Subcutaneous   Inject 25 Units into the skin at bedtime.         Marland Kitchen latanoprost (XALATAN) 0.005 % ophthalmic solution   Both Eyes   Place 1 drop into both eyes at bedtime.         Marland Kitchen losartan (COZAAR) 50 MG tablet   Oral   Take 50 mg by mouth 2 (two) times daily.         . metoprolol (LOPRESSOR) 100 MG tablet   Oral   Take 100 mg by mouth 2 (two) times daily.         . metroNIDAZOLE (METROGEL) 1 % gel   Topical   Apply 1 application topically daily.         . mometasone (NASONEX) 50 MCG/ACT nasal spray   Nasal   Place 2 sprays into the nose as needed.         . Multiple Vitamins-Minerals (CENTRUM SILVER PO)   Oral   Take 1 tablet by mouth daily.         Marland Kitchen oxyCODONE (OXY IR/ROXICODONE) 5 MG immediate release  tablet   Oral   Take 1-2 tablets (5-10 mg total) by mouth every 4 (four) hours as needed for breakthrough pain.   80 tablet   0   . tadalafil (CIALIS) 5 MG tablet   Oral   Take 5 mg by mouth daily as needed for erectile dysfunction.         . traMADol (ULTRAM) 50 MG tablet   Oral   Take 1-2 tablets (50-100 mg total) by mouth every 4 (four) hours as needed for moderate pain.   60 tablet   1   . vitamin C (ASCORBIC ACID) 500 MG tablet   Oral   Take 500 mg by mouth daily.         . vitamin E 400 UNIT capsule   Oral   Take 400 Units by mouth daily.           Allergies Synvisc and Zocor  Family History  Problem Relation Age of Onset  . Diabetes Father     Social History Social History  Substance Use Topics  . Smoking status: Former Smoker -- 0.50 packs/day    Types: Cigarettes  . Smokeless tobacco: Never Used  . Alcohol Use: Yes     Comment: 3 drinks a day    Review of Systems Constitutional: Negative for fever. ENT: Positive for congestion. Cardiovascular: Negative for chest pain. Respiratory: Positive for shortness of breath. Positive for cough. Gastrointestinal: Negative for abdominal pain, vomiting Musculoskeletal: Negative for back pain. Neurological: Negative for headache 10-point ROS otherwise negative.  ____________________________________________   PHYSICAL EXAM:  VITAL SIGNS: ED Triage Vitals  Enc Vitals Group     BP 11/22/15 1235 153/88 mmHg     Pulse Rate 11/22/15 1235 108     Resp 11/22/15 1235 20     Temp 11/22/15 1235 98.7 F (37.1 C)     Temp Source 11/22/15 1235 Oral     SpO2 11/22/15 1235 93 %     Weight 11/22/15 1235 225 lb (102.059 kg)  Height 11/22/15 1235 5\' 8"  (1.727 m)     Head Cir --      Peak Flow --      Pain Score --      Pain Loc --      Pain Edu? --      Excl. in [place]? --     Constitutional: Alert and oriented. Well appearing and in no distress. Eyes: Normal exam ENT   Head: Normocephalic and  atraumatic.   Nose: No congestion noted on exam.   Mouth/Throat: Mucous membranes are moist. Cardiovascular: Regular rhythm, rate around 110 bpm. Respiratory: Normal respiratory effort, decreased breath sounds in all lung fields with mild expiratory wheeze in all lung fields. No obvious rales or rhonchi. Gastrointestinal: Soft and nontender. No distention.  Musculoskeletal: Nontender with normal range of motion in all extremities. 1+ lower extremity edema equal bilaterally, no calf tenderness to palpation. Neurologic:  Normal speech and language. No gross focal neurologic deficits Skin:  Skin is warm, dry and intact.  Psychiatric: Mood and affect are normal. Speech and behavior are normal.   ____________________________________________    EKG  EKG reviewed and interpreted by myself shows sinus tachycardia at 104 bpm, with occasional PACs, narrow QRS, normal axis, largely normal intervals, nonspecific ST changes. No ST elevations.  ____________________________________________    RADIOLOGY  Chest x-ray no acute abnormality CT of the chest shows left lower lobe pneumonia.  ____________________________________________    INITIAL IMPRESSION / ASSESSMENT AND PLAN / ED COURSE  Pertinent labs & imaging results that were available during my care of the patient were reviewed by me and considered in my medical decision making (see chart for details).  Patient presents with dyspnea, cough, congestion, and hypoxia. 78% on room air, 93% on 4 L nasal cannula oxygen. The patient has no home O2 requirement. Chest x-ray negative, labs are largely within normal limits, we'll proceed with CT angiography to rule out pulmonary embolus.  CT scan shows no pulmonary embolus, however the patient does have a left lower lobe pneumonia, remains hypoxic. We will admit the patient to the hospital, have started IV Levaquin and send blood  cultures.  ____________________________________________   FINAL CLINICAL IMPRESSION(S) / ED DIAGNOSES  Dyspnea Cough/congestion Hypoxia community acquired pneumonia    Harvest Dark, MD 11/22/15 [phone]

## 2015-11-22 NOTE — ED Notes (Signed)
Pt came via EMS c/o SOB. Pt sts he went to his primary care doctor Monday and "they did absolutely nothing." Pt went to urgent care this morning and his O2 sat was 78%. Urgent care called EMS. Pt has non productive cough and sts that sometimes he coughs up clear sputum. Blood sugar 229 per EMS.

## 2015-11-23 LAB — CBC
HCT: 39.2 % — ABNORMAL LOW (ref 40.0–52.0)
Hemoglobin: 12.6 g/dL — ABNORMAL LOW (ref 13.0–18.0)
MCH: 31.3 pg (ref 26.0–34.0)
MCHC: 32.2 g/dL (ref 32.0–36.0)
MCV: 97 fL (ref 80.0–100.0)
PLATELETS: 171 10*3/uL (ref 150–440)
RBC: 4.04 MIL/uL — AB (ref 4.40–5.90)
RDW: 14.4 % (ref 11.5–14.5)
WBC: 9.1 10*3/uL (ref 3.8–10.6)

## 2015-11-23 LAB — GLUCOSE, CAPILLARY
GLUCOSE-CAPILLARY: 182 mg/dL — AB (ref 65–99)
Glucose-Capillary: 165 mg/dL — ABNORMAL HIGH (ref 65–99)
Glucose-Capillary: 196 mg/dL — ABNORMAL HIGH (ref 65–99)
Glucose-Capillary: 170 mg/dL — ABNORMAL HIGH (ref 65–99)

## 2015-11-23 LAB — BASIC METABOLIC PANEL
Anion gap: 8 (ref 5–15)
BUN: 26 mg/dL — AB (ref 6–20)
CALCIUM: 9.5 mg/dL (ref 8.9–10.3)
CO2: 30 mmol/L (ref 22–32)
CREATININE: 1.03 mg/dL (ref 0.61–1.24)
Chloride: 105 mmol/L (ref 101–111)
GFR calc non Af Amer: >60 mL/min (ref >60)
Glucose, Bld: 174 mg/dL — ABNORMAL HIGH (ref 65–99)
Potassium: 4.2 mmol/L (ref 3.5–5.1)
SODIUM: 143 mmol/L (ref 135–145)

## 2015-11-23 LAB — HEMOGLOBIN A1C: HEMOGLOBIN A1C: 6.6 % — AB (ref 4.0–6.0)

## 2015-11-23 NOTE — Progress Notes (Signed)
Pt refuses to wear bed alarm. Instructed to call when needing to stand up, verbalized understanding.

## 2015-11-23 NOTE — Progress Notes (Signed)
[place] at [place] NAME: Steven Phelps    MR#:  [id number]  DATE OF BIRTH:  Oct 20, 1931  SUBJECTIVE:  Came in after having increasing shortness of breath cough found to be hypoxic 78% on room air found to have left lower lobe pneumonia. Patient feels much better today. No fever  REVIEW OF SYSTEMS:   Review of Systems  Constitutional: Negative for fever, chills and weight loss.  HENT: Positive for congestion. Negative for ear discharge, ear pain and nosebleeds.   Eyes: Negative for blurred vision, pain and discharge.  Respiratory: Positive for cough and shortness of breath. Negative for sputum production, wheezing and stridor.   Cardiovascular: Negative for chest pain, palpitations, orthopnea and PND.  Gastrointestinal: Negative for nausea, vomiting, abdominal pain and diarrhea.  Genitourinary: Negative for urgency and frequency.  Musculoskeletal: Negative for back pain and joint pain.  Neurological: Positive for weakness. Negative for sensory change, speech change and focal weakness.  Psychiatric/Behavioral: Negative for depression and hallucinations. The patient is not nervous/anxious.   All other systems reviewed and are negative.  Tolerating Diet:yes Tolerating PT: Not needed  DRUG ALLERGIES:   Allergies  Allergen Reactions  . Synvisc [Hylan G-F 20] Other (See Comments)    "severe pain in knee"  . Zocor [Simvastatin] Other (See Comments)    "muscle aches in joints"    VITALS:  Blood pressure 145/85, pulse 76, temperature 98.2 F (36.8 C), temperature source Oral, resp. rate 17, height 5\' 8"  (1.727 m), weight 102.059 kg (225 lb), SpO2 96 %.  PHYSICAL EXAMINATION:   Physical Exam  GENERAL:  81 y.o.-year-old patient lying in the bed with no acute distress.  EYES: Pupils equal, round, reactive to light and accommodation. No scleral icterus. Extraocular muscles intact.  HEENT: Head atraumatic, normocephalic. Oropharynx  and nasopharynx clear.  NECK:  Supple, no jugular venous distention. No thyroid enlargement, no tenderness.  LUNGS:Distant  breath sounds bilaterally, no wheezing, rales,  occasional rhonchi. No use of accessory muscles of respiration.  CARDIOVASCULAR: S1, S2 normal. No murmurs, rubs, or gallops.  ABDOMEN: Soft, nontender, nondistended. Bowel sounds present. No organomegaly or mass.  EXTREMITIES: No cyanosis, clubbing or edema b/l.    NEUROLOGIC: Cranial nerves II through XII are intact. No focal Motor or sensory deficits b/l.   PSYCHIATRIC: The patient is alert and oriented x 3.  SKIN: No obvious rash, lesion, or ulcer.    LABORATORY PANEL:   CBC  Recent Labs Lab 11/23/15 0551  WBC 9.1  HGB 12.6*  HCT 39.2*  PLT 171    Chemistries   Recent Labs Lab 11/22/15 1247 11/23/15 0551  NA 144 143  K 4.0 4.2  CL 105 105  CO2 31 30  GLUCOSE 211* 174*  BUN 31* 26*  CREATININE 1.27* 1.03  CALCIUM 9.7 9.5  AST 63*  --   ALT 50  --   ALKPHOS 97  --   BILITOT 0.9  --     Cardiac Enzymes  Recent Labs Lab 11/22/15 1247  TROPONINI <0.03    RADIOLOGY:  Ct Angio Chest Pe W/cm &/or Wo Cm  11/22/2015  CLINICAL DATA:  Shortness of breath with hypoxemia and nonproductive cough. Hyperglycemia. Evaluate for pulmonary embolism. EXAM: CT ANGIOGRAPHY CHEST WITH CONTRAST TECHNIQUE: Multidetector CT imaging of the chest was performed using the standard protocol during bolus administration of intravenous contrast. Multiplanar CT image reconstructions and MIPs were obtained to evaluate the vascular anatomy. CONTRAST:  40mL OMNIPAQUE IOHEXOL  350 MG/ML SOLN COMPARISON:  Radiograph same date and 11/14/2011. FINDINGS: Mediastinum: The pulmonary arteries are suboptimally opacified with contrast. There is no evidence of acute pulmonary embolism. There is diffuse atherosclerosis of the aorta, great vessels and coronary arteries status post median sternotomy and aortic valve replacement. Small  subcarinal and hilar lymph nodes are not pathologically enlarged. The thyroid gland, trachea and esophagus demonstrate no significant findings. Lungs/Pleura: There is no pleural effusion.Mild emphysematous changes and diffuse central airway thickening are noted. There is focal airspace disease medially in the left lower lobe most consistent with pneumonia, possibly on the basis of aspiration. Upper abdomen: No suspicious findings within the visualized upper abdomen. Diffuse atherosclerosis noted. Musculoskeletal/Chest wall: No chest wall lesion or acute osseous findings.Extensive flowing osteophytes throughout the thoracic spine consistent with diffuse idiopathic skeletal hyperostosis. Review of the MIP images confirms the above findings. IMPRESSION: 1. No evidence of acute pulmonary embolism. 2. Left lower lobe pneumonia. Diffuse central airway thickening superimposed on emphysema. Followup PA and lateral chest X-ray is recommended in 3-4 weeks following trial of antibiotic therapy to ensure resolution and exclude underlying malignancy. 3. Diffuse atherosclerosis. Electronically Signed   By: Richardean Sale M.D.   On: 11/22/2015 15:56   Dg Chest Portable 1 View  11/22/2015  CLINICAL DATA:  Cough and shortness of breath for 1 week, nonsmoker, atrial valve replacement in 2010. EXAM: PORTABLE CHEST 1 VIEW COMPARISON:  Chest x-ray dated 11/14/2011. FINDINGS: Heart size is normal. Overall cardiomediastinal silhouette is stable in size and configuration. Median sternotomy wires appear intact and stable in alignment. Subtle opacities at each lung base are most likely atelectasis. Lungs are otherwise clear. Lung volumes are normal. No evidence of pneumonia. No pleural effusions seen. Degenerative changes noted at the right shoulder joint, at least moderate in degree. No acute-appearing osseous abnormality seen. IMPRESSION: Probable mild bibasilar atelectasis. Lungs otherwise clear. No evidence of pneumonia seen.  Electronically Signed   By: Franki Cabot M.D.   On: 11/22/2015 13:26   ASSESSMENT AND PLAN:  Steven Phelps is a 81 y.o. male who presents with progressive shortness of breath and cough. Patient states that he has been on outpatient antibiotics for the past several days for cough and increasing shortness of breath. However, he states that despite taking these oral antibiotics he was not improving.  1.Acute respiratory failure with hypoxia (HCC) - O2 sats responsive to oxygen via nasal cannula however -Should improve currently on 2 L nasal cannula oxygen with sats 96%. -No wheezing. No indication for steroids. Continue nebulizer and inhalers. -Assess for home oxygen need. Patient is a ex-smoker Chest x-ray suggestive of some emphysema  2.CAP (community acquired pneumonia) - IV antibiotics Levaquin, - Blood culture negative so far  - sputum culture also ordered. Nebs when necessary. Supplemental oxygen as above. -wean oxygen to room if possible  3.Type 2 diabetes mellitus (HCC) - carb modified diet with sliding scale insulin and glucose checks.  4. HTN (hypertension) - continue home meds  5. HLD (hyperlipidemia) - continue home meds   6.A-fib ([place]) - not currently in A. fib, continue home rate controlling medications  Case discussed with Care Management/Social Worker. Management plans discussed with the patient, family and they are in agreement.  CODE STATUS: full  DVT Prophylaxis: lovenox  TOTAL TIME TAKING CARE OF THIS PATIENT: 30 minutes.  >50% time spent on counselling and coordination of care  POSSIBLE D/C IN 1-2 DAYS, DEPENDING ON CLINICAL CONDITION.   Yanessa Hocevar M.D on 11/23/2015 at 12:44 PM  Between 7am to 6pm - Pager - [phone]  After 6pm go to [email or web address] - password [email or web address]  Office  [phone]  CC: Primary care physician; Juluis Pitch, MD

## 2015-11-24 LAB — GLUCOSE, CAPILLARY
Glucose-Capillary: 146 mg/dL — ABNORMAL HIGH (ref 65–99)
Glucose-Capillary: 184 mg/dL — ABNORMAL HIGH (ref 65–99)

## 2015-11-24 MED ORDER — LEVOFLOXACIN 500 MG PO TABS: 750.0000 mg | ORAL_TABLET | ORAL | Status: DC

## 2015-11-24 MED ORDER — LEVOFLOXACIN 750 MG PO TABS: 750.0000 mg | ORAL_TABLET | ORAL | Status: DC

## 2015-11-24 NOTE — Discharge Summary (Signed)
[place] at [place] NAME: Kentyn Magnani    MR#:  [id number]  DATE OF BIRTH:  16-May-1931  DATE OF ADMISSION:  11/22/2015 ADMITTING PHYSICIAN: Lance Coon, MD  DATE OF DISCHARGE: 11/24/2015  PRIMARY CARE PHYSICIAN: Juluis Pitch, MD    ADMISSION DIAGNOSIS:  Community acquired pneumonia [J18.9]  DISCHARGE DIAGNOSIS:  Left lower lobe pneumonia-community-acquired History of COPD Hypoxia secondary to pneumonia now requiring oxygen 2 L/m  SECONDARY DIAGNOSIS:   Past Medical History  Diagnosis Date  . Hypertension   . Heart murmur   . Shortness of breath dyspnea   . Diabetes mellitus without complication ([place])   . Arthritis   . Cancer ([place])     skin  . Glaucoma (increased eye pressure)   . HLD (hyperlipidemia)   . Atrial fibrillation [facility])     HOSPITAL COURSE:  Luay Conrow is a 81 y.o. male who presents with progressive shortness of breath and cough. Patient states that he has been on outpatient antibiotics for the past several days for cough and increasing shortness of breath. However, he states that despite taking these oral antibiotics he was not improving.  1.Acute respiratory failure with hypoxia (HCC) - O2 sats responsive to oxygen via nasal cannula however -Should improve currently on 2 L nasal cannula oxygen with sats 96%. -No wheezing. No indication for steroids. Continue nebulizer and inhalers. -Patient desatted to 86% after ambulation. 2 L nasal cannula oxygen brought sats to 92%. He was 92% prior to ambulation on room air. Patient will qualify for home oxygen - Patient is a ex-smoker Chest x-ray suggestive of some emphysema  2.CAP (community acquired pneumonia) - IV antibiotics Levaquin---changed to by mouth Levaquin for total 07/06/2009 days. - Blood culture negative so far  - Nebs when necessary. Supplemental oxygen as above. -wean oxygen to room if possible  3.Type 2 diabetes mellitus (HCC) - carb  modified diet with sliding scale insulin and glucose checks.  4. HTN (hypertension) - continue home meds  5. HLD (hyperlipidemia) - continue home meds  6.A-fib ([place]) - not currently in A. fib, continue home rate controlling medications  Overall improved. Patient will be discharged home after home oxygen has been set up by Management. CONSULTS OBTAINED:     DRUG ALLERGIES:   Allergies  Allergen Reactions  . Synvisc [Hylan G-F 20] Other (See Comments)    "severe pain in knee"  . Zocor [Simvastatin] Other (See Comments)    "muscle aches in joints"    DISCHARGE MEDICATIONS:   Current Discharge Medication List    START taking these medications   Details  levofloxacin (LEVAQUIN) 750 MG tablet Take 1 tablet (750 mg total) by mouth daily. Qty: 6 tablet, Refills: 0      CONTINUE these medications which have NOT CHANGED   Details  albuterol (PROVENTIL HFA;VENTOLIN HFA) 108 (90 BASE) MCG/ACT inhaler Inhale 2 puffs into the lungs every 6 (six) hours as needed for wheezing or shortness of breath.    amLODipine (NORVASC) 5 MG tablet Take 5 mg by mouth daily.     amoxicillin (AMOXIL) 500 MG capsule Take 500 mg by mouth once. 4 capsules one hour prior to dental appointment    aspirin 81 MG tablet Take 1 tablet (81 mg total) by mouth daily. Qty: 1 tablet, Refills: 0    atorvastatin (LIPITOR) 40 MG tablet Take 40 mg by mouth daily at 6 PM.    Doxycycline Hyclate 50 MG TABS Take 50 mg by  mouth daily as needed (for flares).     folic acid (FOLVITE) [id number] MCG tablet Take 800 mcg by mouth daily.    furosemide (LASIX) 20 MG tablet Take 20 mg by mouth daily.    glipiZIDE (GLUCOTROL) 10 MG tablet Take 10 mg by mouth 2 (two) times daily before a meal.    insulin glargine (LANTUS) 100 unit/mL SOPN Inject 33 Units into the skin at bedtime.     latanoprost (XALATAN) 0.005 % ophthalmic solution Place 1 drop into both eyes at bedtime.    losartan (COZAAR) 50 MG tablet Take 50 mg by mouth 2  (two) times daily.    metoprolol (LOPRESSOR) 100 MG tablet Take 100 mg by mouth 2 (two) times daily.    metroNIDAZOLE (METROGEL) 1 % gel Apply 1 application topically daily.    Multiple Vitamins-Minerals (CENTRUM SILVER PO) Take 1 tablet by mouth daily.    tadalafil (CIALIS) 5 MG tablet Take 5 mg by mouth daily as needed for erectile dysfunction.    vitamin C (ASCORBIC ACID) 500 MG tablet Take 500 mg by mouth daily.    vitamin E 400 UNIT capsule Take 400 Units by mouth daily.    mometasone (NASONEX) 50 MCG/ACT nasal spray Place 2 sprays into the nose as needed (for rhinitis.).       STOP taking these medications     oxyCODONE (OXY IR/ROXICODONE) 5 MG immediate release tablet         If you experience worsening of your admission symptoms, develop shortness of breath, life threatening emergency, suicidal or homicidal thoughts you must seek medical attention immediately by calling 911 or calling your MD immediately  if symptoms less severe.  You Must read complete instructions/literature along with all the possible adverse reactions/side effects for all the Medicines you take and that have been prescribed to you. Take any new Medicines after you have completely understood and accept all the possible adverse reactions/side effects.   Please note  You were cared for by a hospitalist during your hospital stay. If you have any questions about your discharge medications or the care you received while you were in the hospital after you are discharged, you can call the unit and asked to speak with the hospitalist on call if the hospitalist that took care of you is not available. Once you are discharged, your primary care physician will handle any further medical issues. Please note that NO REFILLS for any discharge medications will be authorized once you are discharged, as it is imperative that you return to your primary care physician (or establish a relationship with a primary care physician if  you do not have one) for your aftercare needs so that they can reassess your need for medications and monitor your lab values. Today   SUBJECTIVE     VITAL SIGNS:  Blood pressure 148/82, pulse 69, temperature 97.6 F (36.4 C), temperature source Oral, resp. rate 18, height 5\' 8"  (1.727 m), weight 102.059 kg (225 lb), SpO2 96 %.  I/O:   Intake/Output Summary (Last 24 hours) at 11/24/15 1252 Last data filed at 11/24/15 0830  Gross per 24 hour  Intake    723 ml  Output   1775 ml  Net  -1052 ml    PHYSICAL EXAMINATION:  GENERAL:  81 y.o.-year-old patient lying in the bed with no acute distress.  EYES: Pupils equal, round, reactive to light and accommodation. No scleral icterus. Extraocular muscles intact.  HEENT: Head atraumatic, normocephalic. Oropharynx and nasopharynx clear.  NECK:  Supple, no jugular venous distention. No thyroid enlargement, no tenderness.  LUNGS: Normal breath sounds bilaterally, no wheezing, rales,rhonchi or crepitation. No use of accessory muscles of respiration.  CARDIOVASCULAR: S1, S2 normal. No murmurs, rubs, or gallops.  ABDOMEN: Soft, non-tender, non-distended. Bowel sounds present. No organomegaly or mass.  EXTREMITIES: No pedal edema, cyanosis, or clubbing.  NEUROLOGIC: Cranial nerves II through XII are intact. Muscle strength 5/5 in all extremities. Sensation intact. Gait not checked.  PSYCHIATRIC: The patient is alert and oriented x 3.  SKIN: No obvious rash, lesion, or ulcer.   DATA REVIEW:   CBC   Recent Labs Lab 11/23/15 0551  WBC 9.1  HGB 12.6*  HCT 39.2*  PLT 171    Chemistries   Recent Labs Lab 11/22/15 1247 11/23/15 0551  NA 144 143  K 4.0 4.2  CL 105 105  CO2 31 30  GLUCOSE 211* 174*  BUN 31* 26*  CREATININE 1.27* 1.03  CALCIUM 9.7 9.5  AST 63*  --   ALT 50  --   ALKPHOS 97  --   BILITOT 0.9  --     Microbiology Results   Recent Results (from the past 240 hour(s))  Culture, blood (routine x 2)     Status: None  (Preliminary result)   Collection Time: 11/22/15 12:47 PM  Result Value Ref Range Status   Specimen Description BLOOD LEFT ARM  Final   Special Requests BOTTLES DRAWN AEROBIC AND ANAEROBIC [place]  Final   Culture NO GROWTH 2 DAYS  Final   Report Status PENDING  Incomplete  Culture, blood (routine x 2)     Status: None (Preliminary result)   Collection Time: 11/22/15  1:40 PM  Result Value Ref Range Status   Specimen Description BLOOD RIGHT HAND  Final   Special Requests BOTTLES DRAWN AEROBIC AND ANAEROBIC  1CC  Final   Culture NO GROWTH 2 DAYS  Final   Report Status PENDING  Incomplete    RADIOLOGY:  Ct Angio Chest Pe W/cm &/or Wo Cm  11/22/2015  CLINICAL DATA:  Shortness of breath with hypoxemia and nonproductive cough. Hyperglycemia. Evaluate for pulmonary embolism. EXAM: CT ANGIOGRAPHY CHEST WITH CONTRAST TECHNIQUE: Multidetector CT imaging of the chest was performed using the standard protocol during bolus administration of intravenous contrast. Multiplanar CT image reconstructions and MIPs were obtained to evaluate the vascular anatomy. CONTRAST:  76mL OMNIPAQUE IOHEXOL 350 MG/ML SOLN COMPARISON:  Radiograph same date and 11/14/2011. FINDINGS: Mediastinum: The pulmonary arteries are suboptimally opacified with contrast. There is no evidence of acute pulmonary embolism. There is diffuse atherosclerosis of the aorta, great vessels and coronary arteries status post median sternotomy and aortic valve replacement. Small subcarinal and hilar lymph nodes are not pathologically enlarged. The thyroid gland, trachea and esophagus demonstrate no significant findings. Lungs/Pleura: There is no pleural effusion.Mild emphysematous changes and diffuse central airway thickening are noted. There is focal airspace disease medially in the left lower lobe most consistent with pneumonia, possibly on the basis of aspiration. Upper abdomen: No suspicious findings within the visualized upper abdomen. Diffuse  atherosclerosis noted. Musculoskeletal/Chest wall: No chest wall lesion or acute osseous findings.Extensive flowing osteophytes throughout the thoracic spine consistent with diffuse idiopathic skeletal hyperostosis. Review of the MIP images confirms the above findings. IMPRESSION: 1. No evidence of acute pulmonary embolism. 2. Left lower lobe pneumonia. Diffuse central airway thickening superimposed on emphysema. Followup PA and lateral chest X-ray is recommended in 3-4 weeks following trial of antibiotic therapy to ensure  resolution and exclude underlying malignancy. 3. Diffuse atherosclerosis. Electronically Signed   By: Richardean Sale M.D.   On: 11/22/2015 15:56   Dg Chest Portable 1 View  11/22/2015  CLINICAL DATA:  Cough and shortness of breath for 1 week, nonsmoker, atrial valve replacement in 2010. EXAM: PORTABLE CHEST 1 VIEW COMPARISON:  Chest x-ray dated 11/14/2011. FINDINGS: Heart size is normal. Overall cardiomediastinal silhouette is stable in size and configuration. Median sternotomy wires appear intact and stable in alignment. Subtle opacities at each lung base are most likely atelectasis. Lungs are otherwise clear. Lung volumes are normal. No evidence of pneumonia. No pleural effusions seen. Degenerative changes noted at the right shoulder joint, at least moderate in degree. No acute-appearing osseous abnormality seen. IMPRESSION: Probable mild bibasilar atelectasis. Lungs otherwise clear. No evidence of pneumonia seen. Electronically Signed   By: Franki Cabot M.D.   On: 11/22/2015 13:26     Management plans discussed with the patient, family and they are in agreement.  CODE STATUS:     Code Status Orders        Start     Ordered   11/22/15 1832  Full code   Continuous     11/22/15 1831      TOTAL TIME TAKING CARE OF THIS PATIENT: *40 minutes.    Madilyn Cephas M.D on 11/24/2015 at 12:52 PM  Between 7am to 6pm - Pager - [phone] After 6pm go to [email or web address] - password  [email or web address]  Office  [phone]  CC: Primary care physician; Juluis Pitch, MD

## 2015-11-24 NOTE — Progress Notes (Signed)
ANTIBIOTIC CONSULT NOTE - INITIAL  Pharmacy Consult for levofloxacin Indication: pneumonia  Allergies  Allergen Reactions  . Synvisc [Hylan G-F 20] Other (See Comments)    "severe pain in knee"  . Zocor [Simvastatin] Other (See Comments)    "muscle aches in joints"    Patient Measurements: Height: 5\' 8"  (172.7 cm) Weight: 225 lb (102.059 kg) IBW/kg (Calculated) : 68.4  Vital Signs: Temp: 97.7 F (36.5 C) (12/20 0405) Temp Source: Oral (12/20 0405) BP: 138/68 mmHg (12/20 0957) Pulse Rate: 76 (12/20 0957) Intake/Output from previous day: 12/19 0701 - 12/20 0700 In: 723 [P.O.:720; I.V.:3] Out: 1700 [Urine:1700] Intake/Output from this shift: Total I/O In: -  Out: 325 [Urine:325]  Labs:  Recent Labs  11/22/15 1247 11/23/15 0551  WBC 6.6 9.1  HGB 12.0* 12.6*  PLT 176 171  CREATININE 1.27* 1.03   Estimated Creatinine Clearance: 65.2 mL/min (by C-G formula based on Cr of 1.03). No results for input(s): VANCOTROUGH, VANCOPEAK, VANCORANDOM, GENTTROUGH, GENTPEAK, GENTRANDOM, TOBRATROUGH, TOBRAPEAK, TOBRARND, AMIKACINPEAK, AMIKACINTROU, AMIKACIN in the last 72 hours.   Microbiology: Recent Results (from the past 720 hour(s))  Culture, blood (routine x 2)     Status: None (Preliminary result)   Collection Time: 11/22/15 12:47 PM  Result Value Ref Range Status   Specimen Description BLOOD LEFT ARM  Final   Special Requests BOTTLES DRAWN AEROBIC AND ANAEROBIC [place]  Final   Culture NO GROWTH < 24 HOURS  Final   Report Status PENDING  Incomplete  Culture, blood (routine x 2)     Status: None (Preliminary result)   Collection Time: 11/22/15  1:40 PM  Result Value Ref Range Status   Specimen Description BLOOD RIGHT HAND  Final   Special Requests BOTTLES DRAWN AEROBIC AND ANAEROBIC  1CC  Final   Culture NO GROWTH < 24 HOURS  Final   Report Status PENDING  Incomplete    Assessment: Pharmacy consulted to dose levofloxacin in this 81 year old male for CAP.   CrCl ~ 65  mL/min  Patient currently prescribed levofloxacin 750 mg IV daily and is on day #3 of antibiotics  Plan:  Follow up culture results  Changed to PO antibiotics, will continue with dose of 750 mg daily  Darylene Price Colon Rueth 11/24/2015,11:03 AM

## 2015-11-24 NOTE — Progress Notes (Signed)
PHARMACIST - PHYSICIAN COMMUNICATION DR:   Posey Pronto CONCERNING: Antibiotic IV to Oral Route Change Policy  RECOMMENDATION: This patient is receiving levofloxacin by the intravenous route.  Based on criteria approved by the Pharmacy and Therapeutics Committee, the antibiotic(s) is/are being converted to the equivalent oral dose form(s).   DESCRIPTION: These criteria include:  Patient being treated for a respiratory tract infection, urinary tract infection, cellulitis or clostridium difficile associated diarrhea if on metronidazole  The patient is not neutropenic and does not exhibit a GI malabsorption state  The patient is eating (either orally or via tube) and/or has been taking other orally administered medications for a least 24 hours  The patient is improving clinically and has a Tmax < 100.5  Parker Wherley M Sommer Spickard 11:06 AM

## 2015-11-24 NOTE — Progress Notes (Signed)
SATURATION QUALIFICATIONS: (This note is used to comply with regulatory documentation for home oxygen)  Patient Saturations on Room Air at Rest = 94%  Patient Saturations on Room Air while Ambulating = 86%  Patient Saturations on 2 Liters of oxygen while Ambulating = 93%  Please briefly explain why patient needs home oxygen: Hx of COPD patient desaturates during excertion.

## 2015-11-24 NOTE — Discharge Instructions (Signed)
Use oxygen as directed °

## 2015-11-24 NOTE — Care Management (Signed)
Spoke with patient about home O2 delivery. Patient is independent and able to drive self. Patient stated that he will be living with his son for a few days after discharge and would like his O2 delivered there. Referral placed with Will Anderson at [facility] for 02 delivery. Patient does not use a walker at home but has a cane. No issues with meds or PCP.  No other CM needs identified.

## 2015-11-27 LAB — CULTURE, BLOOD (ROUTINE X 2)
CULTURE: NO GROWTH
Culture: NO GROWTH

## 2015-11-28 LAB — BLOOD GAS, ARTERIAL
Acid-Base Excess: 6.7 mmol/L — ABNORMAL HIGH (ref 0.0–3.0)
Allens test (pass/fail): POSITIVE — AB
Bicarbonate: 32.4 meq/L — ABNORMAL HIGH (ref 21.0–28.0)
FIO2: 0.44
O2 Saturation: 91.5 %
Patient temperature: 37
pCO2 arterial: 50 mmHg — ABNORMAL HIGH (ref 32.0–48.0)
pH, Arterial: 7.42 (ref 7.350–7.450)
pO2, Arterial: 61 mmHg — ABNORMAL LOW (ref 83.0–108.0)

## 2016-03-14 IMAGING — CR DG KNEE 1-2V PORT*L*
1 series · 2 of 2 positions shown · non-contrast
Comparison: None.

CLINICAL DATA: Postoperative total knee replacement

EXAM:
PORTABLE LEFT KNEE - 1-2 VIEW

[Series 1: ap · 0.17mm/px · 2 of 2 slices shown]
[im 1/2]
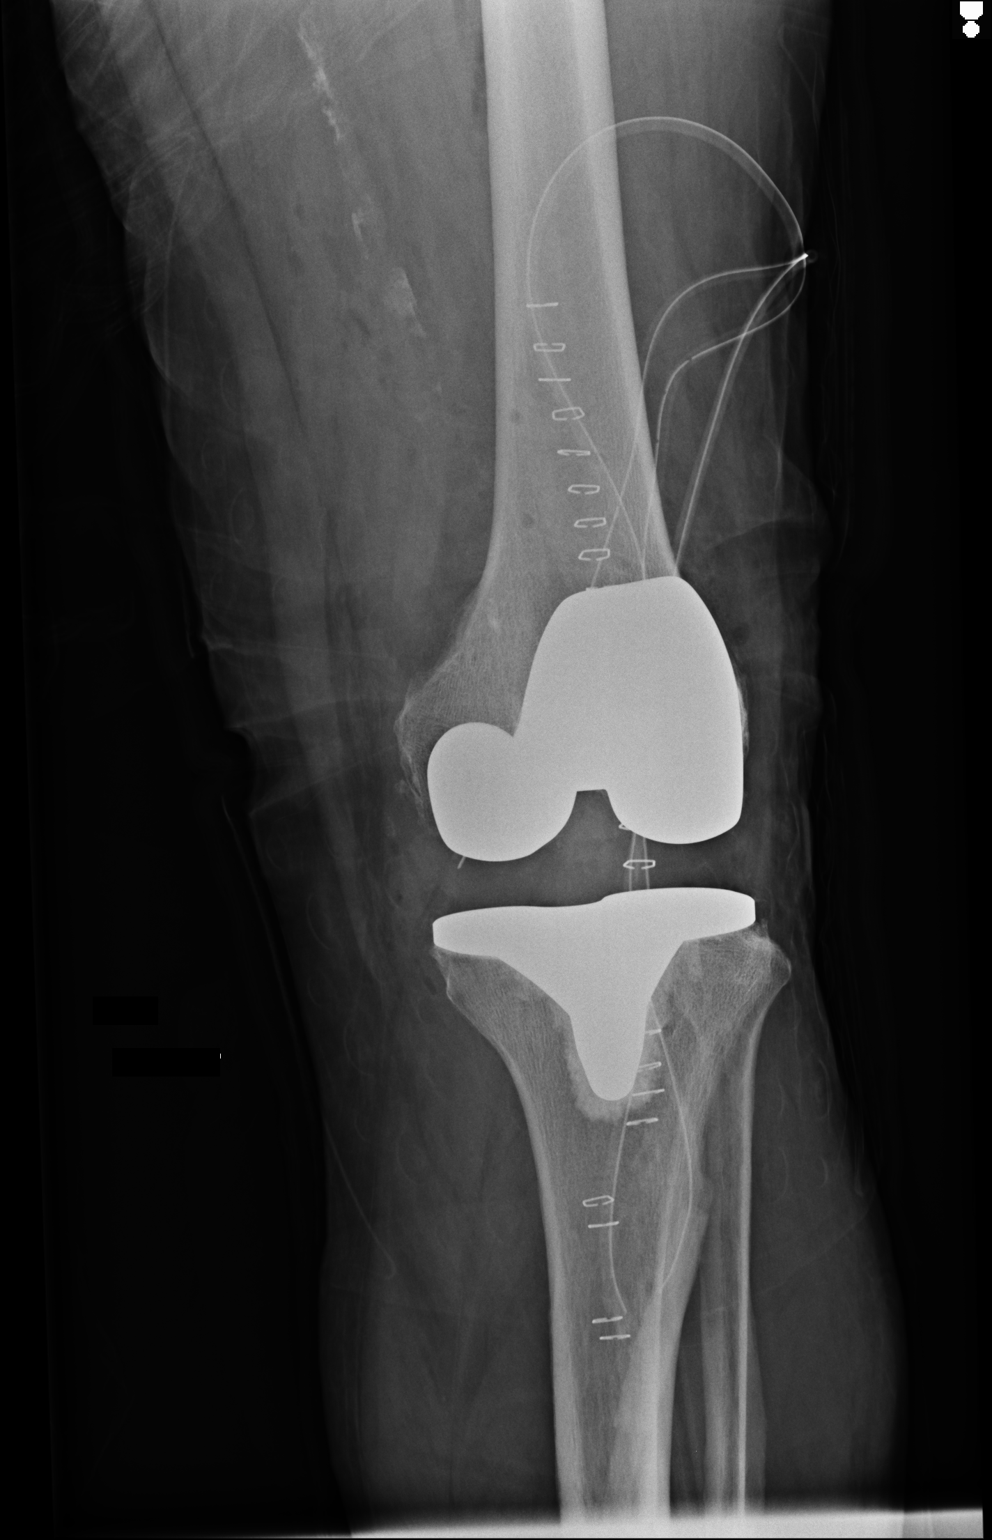
[im 2/2]
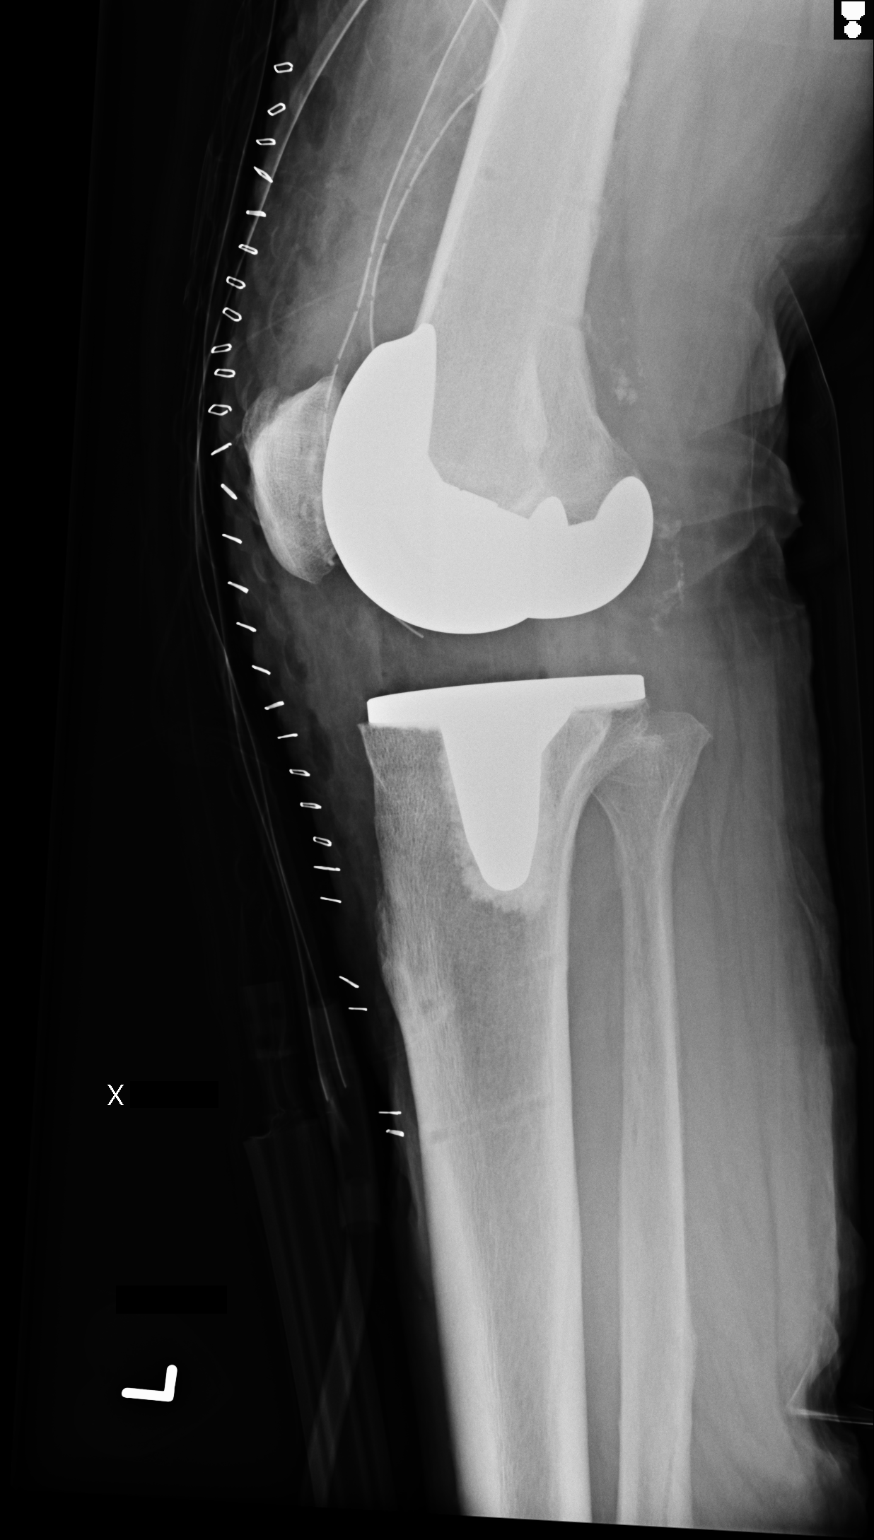

[2 of 2 positions shown; findings below may reference images not displayed]

FINDINGS: Frontal and lateral views obtained. The patient is status post total
knee replacement with femoral and tibial prosthetic components
appearing well-seated. No fracture or dislocation. Small joint
effusion present. There is a spur arising from the anterior superior
patella. There is a drain within the anterior knee joint region.
IMPRESSION: Prosthetic components appear well seated. No acute fracture or
dislocation.

## 2018-05-12 ENCOUNTER — Emergency Department: Payer: Medicare Other

## 2018-05-12 ENCOUNTER — Encounter: Payer: Self-pay | Admitting: Internal Medicine

## 2018-05-12 ENCOUNTER — Inpatient Hospital Stay
Admission: EM | Admit: 2018-05-12 | Discharge: 2018-05-14 | DRG: 683 | Disposition: A | Discharge status: Home / Self Care | Payer: Medicare Other | Attending: Internal Medicine | Admitting: Internal Medicine

## 2018-05-12 ENCOUNTER — Other Ambulatory Visit: Payer: Self-pay

## 2018-05-12 DIAGNOSIS — Z833 Family history of diabetes mellitus: Secondary | ICD-10-CM

## 2018-05-12 DIAGNOSIS — Z85828 Personal history of other malignant neoplasm of skin: Secondary | ICD-10-CM | POA: Diagnosis not present

## 2018-05-12 DIAGNOSIS — E1151 Type 2 diabetes mellitus with diabetic peripheral angiopathy without gangrene: Secondary | ICD-10-CM | POA: Diagnosis present

## 2018-05-12 DIAGNOSIS — Y92122 Bedroom in nursing home as the place of occurrence of the external cause: Secondary | ICD-10-CM | POA: Diagnosis not present

## 2018-05-12 DIAGNOSIS — M25562 Pain in left knee: Secondary | ICD-10-CM | POA: Diagnosis present

## 2018-05-12 DIAGNOSIS — E875 Hyperkalemia: Secondary | ICD-10-CM | POA: Diagnosis present

## 2018-05-12 DIAGNOSIS — R0789 Other chest pain: Secondary | ICD-10-CM | POA: Diagnosis present

## 2018-05-12 DIAGNOSIS — Z952 Presence of prosthetic heart valve: Secondary | ICD-10-CM | POA: Diagnosis not present

## 2018-05-12 DIAGNOSIS — Z79899 Other long term (current) drug therapy: Secondary | ICD-10-CM | POA: Diagnosis not present

## 2018-05-12 DIAGNOSIS — W19XXXA Unspecified fall, initial encounter: Secondary | ICD-10-CM | POA: Diagnosis present

## 2018-05-12 DIAGNOSIS — R7989 Other specified abnormal findings of blood chemistry: Secondary | ICD-10-CM | POA: Diagnosis present

## 2018-05-12 DIAGNOSIS — R0781 Pleurodynia: Secondary | ICD-10-CM | POA: Diagnosis present

## 2018-05-12 DIAGNOSIS — E1165 Type 2 diabetes mellitus with hyperglycemia: Secondary | ICD-10-CM | POA: Diagnosis present

## 2018-05-12 DIAGNOSIS — Z96652 Presence of left artificial knee joint: Secondary | ICD-10-CM | POA: Diagnosis present

## 2018-05-12 DIAGNOSIS — Z7982 Long term (current) use of aspirin: Secondary | ICD-10-CM

## 2018-05-12 DIAGNOSIS — G9340 Encephalopathy, unspecified: Secondary | ICD-10-CM

## 2018-05-12 DIAGNOSIS — M25532 Pain in left wrist: Secondary | ICD-10-CM | POA: Diagnosis present

## 2018-05-12 DIAGNOSIS — E871 Hypo-osmolality and hyponatremia: Secondary | ICD-10-CM | POA: Diagnosis present

## 2018-05-12 DIAGNOSIS — M1990 Unspecified osteoarthritis, unspecified site: Secondary | ICD-10-CM | POA: Diagnosis present

## 2018-05-12 DIAGNOSIS — E785 Hyperlipidemia, unspecified: Secondary | ICD-10-CM | POA: Diagnosis present

## 2018-05-12 DIAGNOSIS — I10 Essential (primary) hypertension: Secondary | ICD-10-CM | POA: Diagnosis present

## 2018-05-12 DIAGNOSIS — Z888 Allergy status to other drugs, medicaments and biological substances status: Secondary | ICD-10-CM | POA: Diagnosis not present

## 2018-05-12 DIAGNOSIS — Z794 Long term (current) use of insulin: Secondary | ICD-10-CM

## 2018-05-12 DIAGNOSIS — Z87891 Personal history of nicotine dependence: Secondary | ICD-10-CM | POA: Diagnosis not present

## 2018-05-12 DIAGNOSIS — E861 Hypovolemia: Secondary | ICD-10-CM | POA: Diagnosis present

## 2018-05-12 DIAGNOSIS — R52 Pain, unspecified: Secondary | ICD-10-CM | POA: Diagnosis not present

## 2018-05-12 DIAGNOSIS — H409 Unspecified glaucoma: Secondary | ICD-10-CM | POA: Diagnosis present

## 2018-05-12 DIAGNOSIS — N179 Acute kidney failure, unspecified: Secondary | ICD-10-CM | POA: Diagnosis present

## 2018-05-12 LAB — CBC WITH DIFFERENTIAL/PLATELET
Basophils Absolute: 0.1 10*3/uL (ref 0–0.1)
Basophils Relative: 1 %
EOS ABS: 0 10*3/uL (ref 0–0.7)
EOS PCT: 0 %
HCT: 36.7 % — ABNORMAL LOW (ref 40.0–52.0)
Hemoglobin: 12.3 g/dL — ABNORMAL LOW (ref 13.0–18.0)
LYMPHS ABS: 1 10*3/uL (ref 1.0–3.6)
Lymphocytes Relative: 13 %
MCH: 30.5 pg (ref 26.0–34.0)
MCHC: 33.5 g/dL (ref 32.0–36.0)
MCV: 91.1 fL (ref 80.0–100.0)
MONOS PCT: 6 %
Monocytes Absolute: 0.4 10*3/uL (ref 0.2–1.0)
Neutro Abs: 6.3 10*3/uL (ref 1.4–6.5)
Neutrophils Relative %: 80 %
Platelets: 219 10*3/uL (ref 150–440)
RBC: 4.02 MIL/uL — ABNORMAL LOW (ref 4.40–5.90)
RDW: 13.9 % (ref 11.5–14.5)
WBC: 7.8 10*3/uL (ref 3.8–10.6)

## 2018-05-12 LAB — BASIC METABOLIC PANEL
ANION GAP: 8 (ref 5–15)
BUN: 56 mg/dL — AB (ref 6–20)
CHLORIDE: 103 mmol/L (ref 101–111)
CO2: 22 mmol/L (ref 22–32)
Calcium: 8.9 mg/dL (ref 8.9–10.3)
Creatinine, Ser: 1.76 mg/dL — ABNORMAL HIGH (ref 0.61–1.24)
GFR calc Af Amer: 39 mL/min — ABNORMAL LOW (ref >60)
GFR calc non Af Amer: 34 mL/min — ABNORMAL LOW (ref >60)
Glucose, Bld: 337 mg/dL — ABNORMAL HIGH (ref 65–99)
POTASSIUM: 4.2 mmol/L (ref 3.5–5.1)
SODIUM: 133 mmol/L — AB (ref 135–145)

## 2018-05-12 LAB — COMPREHENSIVE METABOLIC PANEL
ALK PHOS: 112 U/L (ref 38–126)
ALT: 13 U/L — AB (ref 17–63)
AST: 15 U/L (ref 15–41)
Albumin: 3.7 g/dL (ref 3.5–5.0)
Anion gap: 10 (ref 5–15)
BUN: 69 mg/dL — AB (ref 6–20)
CALCIUM: 8.6 mg/dL — AB (ref 8.9–10.3)
CHLORIDE: 100 mmol/L — AB (ref 101–111)
CO2: 19 mmol/L — AB (ref 22–32)
CREATININE: 2.42 mg/dL — AB (ref 0.61–1.24)
GFR calc Af Amer: 27 mL/min — ABNORMAL LOW (ref >60)
GFR, EST NON AFRICAN AMERICAN: 23 mL/min — AB (ref >60)
Glucose, Bld: 468 mg/dL — ABNORMAL HIGH (ref 65–99)
Potassium: 6.1 mmol/L — ABNORMAL HIGH (ref 3.5–5.1)
SODIUM: 129 mmol/L — AB (ref 135–145)
Total Bilirubin: 0.7 mg/dL (ref 0.3–1.2)
Total Protein: 6.8 g/dL (ref 6.5–8.1)

## 2018-05-12 LAB — HEMOGLOBIN A1C
Hgb A1c MFr Bld: 12.4 % — ABNORMAL HIGH (ref 4.8–5.6)
MEAN PLASMA GLUCOSE: 309.18 mg/dL

## 2018-05-12 LAB — TROPONIN I

## 2018-05-12 LAB — URINALYSIS, COMPLETE (UACMP) WITH MICROSCOPIC
BACTERIA UA: NONE SEEN
Bilirubin Urine: NEGATIVE
Glucose, UA: >=500 mg/dL — AB
Ketones, ur: NEGATIVE mg/dL
Leukocytes, UA: NEGATIVE
Nitrite: NEGATIVE
PROTEIN: NEGATIVE mg/dL
Specific Gravity, Urine: 1.014 (ref 1.005–1.030)
pH: 5 (ref 5.0–8.0)

## 2018-05-12 LAB — GLUCOSE, CAPILLARY
GLUCOSE-CAPILLARY: 314 mg/dL — AB (ref 65–99)
Glucose-Capillary: 274 mg/dL — ABNORMAL HIGH (ref 65–99)

## 2018-05-12 LAB — BRAIN NATRIURETIC PEPTIDE: B Natriuretic Peptide: 52 pg/mL (ref 0.0–100.0)

## 2018-05-12 LAB — SODIUM, URINE, RANDOM: Sodium, Ur: 40 mmol/L

## 2018-05-12 LAB — OSMOLALITY, URINE: Osmolality, Ur: 501 mOsm/kg (ref 300–900)

## 2018-05-12 LAB — CK: CK TOTAL: 47 U/L — AB (ref 49–397)

## 2018-05-12 MED ORDER — ATORVASTATIN CALCIUM 20 MG PO TABS
40.0000 mg | ORAL_TABLET | Freq: Every day | ORAL | Status: DC
  Administered 2018-05-13: 40 mg via ORAL
  Filled 2018-05-12: qty 2

## 2018-05-12 MED ORDER — INSULIN ASPART 100 UNIT/ML ~~LOC~~ SOLN
0.0000 [IU] | Freq: Three times a day (TID) | SUBCUTANEOUS | Status: DC
  Administered 2018-05-12: 7 [IU] via SUBCUTANEOUS
  Administered 2018-05-13 (×3): 2 [IU] via SUBCUTANEOUS
  Administered 2018-05-14: 3 [IU] via SUBCUTANEOUS
  Filled 2018-05-12 (×6): qty 1

## 2018-05-12 MED ORDER — SODIUM CHLORIDE 0.9 % IV BOLUS
1000.0000 mL | Freq: Once | INTRAVENOUS | Status: AC
  Administered 2018-05-12: 1000 mL via INTRAVENOUS

## 2018-05-12 MED ORDER — ONDANSETRON HCL 4 MG/2ML IJ SOLN: 4.0000 mg | Freq: Four times a day (QID) | INTRAMUSCULAR | Status: DC | PRN

## 2018-05-12 MED ORDER — SODIUM BICARBONATE 8.4 % IV SOLN
25.0000 meq | Freq: Once | INTRAVENOUS | Status: AC
  Administered 2018-05-12: 25 meq via INTRAVENOUS
  Filled 2018-05-12: qty 50

## 2018-05-12 MED ORDER — LATANOPROST 0.005 % OP SOLN
1.0000 [drp] | Freq: Every day | OPHTHALMIC | Status: DC
  Administered 2018-05-12 – 2018-05-13 (×2): 1 [drp] via OPHTHALMIC
  Filled 2018-05-12: qty 2.5

## 2018-05-12 MED ORDER — QUETIAPINE FUMARATE 25 MG PO TABS
25.0000 mg | ORAL_TABLET | ORAL | Status: AC
  Administered 2018-05-12: 25 mg via ORAL
  Filled 2018-05-12: qty 1

## 2018-05-12 MED ORDER — INSULIN ASPART 100 UNIT/ML ~~LOC~~ SOLN: 0.0000 [IU] | Freq: Every day | SUBCUTANEOUS | Status: DC

## 2018-05-12 MED ORDER — ACETAMINOPHEN 325 MG PO TABS: 650.0000 mg | ORAL_TABLET | Freq: Four times a day (QID) | ORAL | Status: DC | PRN

## 2018-05-12 MED ORDER — SODIUM CHLORIDE 0.9 % IV SOLN
INTRAVENOUS | Status: DC
  Administered 2018-05-12 – 2018-05-14 (×4): via INTRAVENOUS

## 2018-05-12 MED ORDER — INSULIN ASPART 100 UNIT/ML ~~LOC~~ SOLN
3.0000 [IU] | Freq: Three times a day (TID) | SUBCUTANEOUS | Status: DC
  Filled 2018-05-12: qty 1

## 2018-05-12 MED ORDER — ENOXAPARIN SODIUM 40 MG/0.4ML ~~LOC~~ SOLN
30.0000 mg | SUBCUTANEOUS | Status: DC
  Administered 2018-05-12 – 2018-05-13 (×2): 30 mg via SUBCUTANEOUS
  Filled 2018-05-12 (×2): qty 0.4

## 2018-05-12 MED ORDER — INSULIN ASPART 100 UNIT/ML ~~LOC~~ SOLN
10.0000 [IU] | Freq: Once | SUBCUTANEOUS | Status: AC
  Administered 2018-05-12: 10 [IU] via INTRAVENOUS
  Filled 2018-05-12: qty 1

## 2018-05-12 MED ORDER — ACETAMINOPHEN 650 MG RE SUPP: 650.0000 mg | Freq: Four times a day (QID) | RECTAL | Status: DC | PRN

## 2018-05-12 MED ORDER — ASPIRIN EC 81 MG PO TBEC
81.0000 mg | DELAYED_RELEASE_TABLET | Freq: Every day | ORAL | Status: DC
  Administered 2018-05-13 – 2018-05-14 (×2): 81 mg via ORAL
  Filled 2018-05-12 (×2): qty 1

## 2018-05-12 MED ORDER — INSULIN GLARGINE 100 UNIT/ML ~~LOC~~ SOLN
38.0000 [IU] | Freq: Every day | SUBCUTANEOUS | Status: DC
  Administered 2018-05-12 – 2018-05-13 (×2): 38 [IU] via SUBCUTANEOUS
  Filled 2018-05-12 (×3): qty 0.38

## 2018-05-12 MED ORDER — ONDANSETRON HCL 4 MG PO TABS: 4.0000 mg | ORAL_TABLET | Freq: Four times a day (QID) | ORAL | Status: DC | PRN

## 2018-05-12 MED ORDER — SODIUM CHLORIDE 0.9 % IV SOLN
1.0000 g | Freq: Once | INTRAVENOUS | Status: AC
  Administered 2018-05-12: 1 g via INTRAVENOUS
  Filled 2018-05-12: qty 10

## 2018-05-12 NOTE — H&P (Signed)
[facility] NAME: Steven Phelps    MR#:  [id number]  DATE OF BIRTH:  [birth year]  DATE OF ADMISSION:  05/12/2018  PRIMARY CARE PHYSICIAN: Juluis Pitch, MD   REQUESTING/REFERRING PHYSICIAN: Dr Darel Hong  CHIEF COMPLAINT:   Chief Complaint  Patient presents with  . Fall    HISTORY OF PRESENT ILLNESS:  Steven Phelps  is a 84 y.o. male with a known history of diabetes hypertension hyperlipidemia presents after a fall.  The patient lives in assisted living and normally walks with a cane.  He has recently started back on blood pressure medications.  Patient states that after the fall he had some rib pain and knee pain but currently not having pain.  In the ER he was found to have a an acute kidney injury with a creatinine of 2.42 and a potassium of 6.1 and a sodium of 129.  Hospitalist services were contacted for further evaluation.  PAST MEDICAL HISTORY:   Past Medical History:  Diagnosis Date  . Arthritis   . Atrial fibrillation ([place])   . Cancer ([place])    skin  . Diabetes mellitus without complication ([place])   . Glaucoma (increased eye pressure)   . Heart murmur   . HLD (hyperlipidemia)   . Hypertension   . Shortness of breath dyspnea     PAST SURGICAL HISTORY:   Past Surgical History:  Procedure Laterality Date  . AORTIC VALVE REPLACEMENT  2010  . CARDIAC CATHETERIZATION    . CHOLECYSTECTOMY    . EYE SURGERY Bilateral    cataract extraction  . TONSILLECTOMY    . TOTAL KNEE ARTHROPLASTY Left 06/03/2015   Procedure: TOTAL KNEE ARTHROPLASTY;  Surgeon: Dereck Leep, MD;  Location: [facility] ORS;  Service: Orthopedics;  Laterality: Left;    SOCIAL HISTORY:   Social History   Tobacco Use  . Smoking status: Former Smoker    Packs/day: 0.50    Types: Cigarettes  . Smokeless tobacco: Never Used  Substance Use Topics  . Alcohol use: Yes    Comment: 3 drinks a day    FAMILY HISTORY:   Family History   Problem Relation Age of Onset  . Diabetes Father   . CAD Mother     DRUG ALLERGIES:   Allergies  Allergen Reactions  . Synvisc [Hylan G-F 20] Other (See Comments)    "severe pain in knee"  . Zocor [Simvastatin] Other (See Comments)    "muscle aches in joints"    REVIEW OF SYSTEMS:  CONSTITUTIONAL: No fever, fatigue or weakness.  EYES: No blurred or double vision.  EARS, NOSE, AND THROAT: No tinnitus or ear pain. No sore throat.  Wears hearing aids. RESPIRATORY: No cough, shortness of breath, wheezing or hemoptysis.  CARDIOVASCULAR: No chest pain, orthopnea, edema.  GASTROINTESTINAL: No nausea, vomiting, diarrhea or abdominal pain. No blood in bowel movements GENITOURINARY: No dysuria, hematuria.  ENDOCRINE: No polyuria, nocturia,  HEMATOLOGY: No anemia, easy bruising or bleeding SKIN: No rash or lesion. MUSCULOSKELETAL:  did have some rib pain and knee pain after the fall. NEUROLOGIC: No tingling, numbness, weakness.  PSYCHIATRY: No anxiety or depression.   MEDICATIONS AT HOME:   Prior to Admission medications   Medication Sig Start Date End Date Taking? Authorizing Provider  amLODipine (NORVASC) 5 MG tablet Take 5 mg by mouth daily.    Yes [provider]  aspirin 81 MG tablet Take 1 tablet (81 mg total) by mouth daily. 06/20/15  Yes Watt Climes, PA  atorvastatin (LIPITOR) 40 MG tablet Take 40 mg by mouth daily at 6 PM.   Yes [provider]  furosemide (LASIX) 20 MG tablet Take 20 mg by mouth 2 (two) times daily.    Yes [provider]  glipiZIDE (GLUCOTROL) 10 MG tablet Take 10 mg by mouth 2 (two) times daily before a meal.   Yes [provider]  insulin glargine (LANTUS) 100 unit/mL SOPN Inject 30 Units into the skin daily.    Yes [provider]  latanoprost (XALATAN) 0.005 % ophthalmic solution Place 1 drop into both eyes at bedtime.   Yes [provider]  losartan (COZAAR) 50 MG tablet Take 50 mg by mouth 2 (two)  times daily.   Yes [provider]  metoprolol (LOPRESSOR) 100 MG tablet Take 100 mg by mouth 2 (two) times daily.   Yes [provider]      VITAL SIGNS:  Blood pressure 112/63, pulse 89, temperature 98 F (36.7 C), temperature source Oral, resp. rate 18, height 5\' 8"  (1.727 m), weight 95.3 kg (210 lb), SpO2 95 %.  PHYSICAL EXAMINATION:  GENERAL:  84 y.o.-year-old patient lying in the bed with no acute distress.  EYES: Pupils equal, round, reactive to light and accommodation. No scleral icterus. Extraocular muscles intact.  HEENT: Head atraumatic, normocephalic. Oropharynx and nasopharynx clear.  NECK:  Supple, no jugular venous distention. No thyroid enlargement, no tenderness.  LUNGS: Normal breath sounds bilaterally, no wheezing, rales,rhonchi or crepitation. No use of accessory muscles of respiration.  CARDIOVASCULAR: S1, S2 normal. No murmurs, rubs, or gallops.  ABDOMEN: Soft, nontender, nondistended. Bowel sounds present. No organomegaly or mass.  EXTREMITIES: Trace pedal edema.  Cyanosis, or clubbing.  NEUROLOGIC: Cranial nerves II through XII are intact. Muscle strength 5/5 in all extremities. Sensation intact. Gait not checked.  PSYCHIATRIC: The patient is alert and  Answers questions. SKIN: No rash, lesion, or ulcer.   LABORATORY PANEL:   CBC Recent Labs  Lab 05/12/18 0944  WBC 7.8  HGB 12.3*  HCT 36.7*  PLT 219   ------------------------------------------------------------------------------------------------------------------  Chemistries  Recent Labs  Lab 05/12/18 0944  NA 129*  K 6.1*  CL 100*  CO2 19*  GLUCOSE 468*  BUN 69*  CREATININE 2.42*  CALCIUM 8.6*  AST 15  ALT 13*  ALKPHOS 112  BILITOT 0.7   ------------------------------------------------------------------------------------------------------------------  Cardiac Enzymes Recent Labs  Lab 05/12/18 0944  TROPONINI <0.03    ------------------------------------------------------------------------------------------------------------------  RADIOLOGY:  Dg Chest 1 View  Result Date: 05/12/2018 CLINICAL DATA:  Pain in the left chest wall EXAM: CHEST  1 VIEW COMPARISON:  11/22/2015 FINDINGS: Chronic airway thickening. Mild scarring or atelectasis at the left base. Status post aortic valve replacement. Normal heart size and aortic contours. 15 mm nodular density at the right apex. No acute osseous finding. Glenohumeral osteoarthritis on the right. IMPRESSION: 1. No evidence of active disease. 2. Nodular density at the right apex, recommend noncontrast chest CT when appropriate. 3. COPD. Electronically Signed   By: Monte Fantasia M.D.   On: 05/12/2018 10:39   Dg Wrist Complete Left  Result Date: 05/12/2018 CLINICAL DATA:  Pain following fall EXAM: LEFT WRIST - COMPLETE 3+ VIEW COMPARISON:  None. FINDINGS: Frontal, oblique, and lateral views were obtained. There is no evident acute fracture or dislocation. There is advanced osteoarthritis in the lateral radial carpal region with remodeling of the radial styloid and scaphoid region. There is moderate osteoarthritic change in the mid carpal  row as well. No erosive changes. IMPRESSION: Osteoarthritic change in the proximal mid wrist regions, most severe laterally in the proximal carpal row region with remodeling of the radial styloid and scaphoid. Question old trauma residua in this area. No acute fracture or dislocation evident.  No erosive changes. Electronically Signed   By: Lowella Grip III M.D.   On: 05/12/2018 10:39   Ct Head Wo Contrast  Result Date: 05/12/2018 CLINICAL DATA:  Unwitnessed fall with confusion EXAM: CT HEAD WITHOUT CONTRAST TECHNIQUE: Contiguous axial images were obtained from the base of the skull through the vertex without intravenous contrast. COMPARISON:  None. FINDINGS: Brain: There is moderate diffuse atrophy. There is no intracranial mass,  hemorrhage, extra-axial fluid collection, or midline shift. There is patchy small vessel disease in the centra semiovale bilaterally. No acute infarct is evident. Vascular: No vascular calcifications are appreciable. There is calcification in each distal vertebral artery as well as in each carotid siphon region. Skull: There is a defect in the anterior left temporal bone, likely of postoperative etiology. Bony calvarium elsewhere intact. Sinuses/Orbits: There is mild mucosal thickening in the right maxillary antrum. There is mucosal thickening in several ethmoid air cells bilaterally. Other visualized paranasal sinuses are clear. Orbits appear symmetric bilaterally. Other: Mastoids on the left are clear. There is opacification in multiple mastoid air cells on the right. IMPRESSION: Atrophy with patchy small vessel disease in the periventricular white matter. No acute infarct. No mass or hemorrhage. There are foci of arterial vascular calcification. Question postoperative defect anterior left temporal bone. Bony calvarium otherwise appears unremarkable. Right-sided mastoid air cell disease. Areas of paranasal sinus disease as well. Electronically Signed   By: Lowella Grip III M.D.   On: 05/12/2018 11:56   Ct Pelvis Wo Contrast  Result Date: 05/12/2018 CLINICAL DATA:  Fall today. Severe left hip pain. Insufficient hip radiographs. Initial encounter. EXAM: CT PELVIS WITHOUT CONTRAST TECHNIQUE: Multidetector CT imaging of the pelvis was performed following the standard protocol without intravenous contrast. COMPARISON:  None. FINDINGS: Urinary Tract: Unremarkable urinary bladder. Bowel: Mild diverticulosis of visualized portion of descending and sigmoid colon. No evidence of diverticulitis in these regions. Vascular/Lymphatic: No pathologically enlarged lymph nodes or other significant abnormality. Reproductive:  No mass or other significant abnormality. Other: No evidence of pelvic hematoma or hemoperitoneum.  Musculoskeletal: No evidence of acute hip or pelvic fracture. Old fracture deformity of the left pubis noted. IMPRESSION: No evidence of hip or pelvic fracture, or other acute findings. Electronically Signed   By: Earle Gell M.D.   On: 05/12/2018 11:57   Dg Knee Complete 4 Views Left  Result Date: 05/12/2018 CLINICAL DATA:  Fall onto left chest with pain.  Left knee pain. EXAM: LEFT KNEE - COMPLETE 4+ VIEW COMPARISON:  06/03/2015 FINDINGS: Total knee arthroplasty is well seated. Small joint effusion. No fracture or malalignment. Osteopenia and atherosclerosis. IMPRESSION: 1. Small joint effusion without fracture. 2. Total knee arthroplasty. Electronically Signed   By: Monte Fantasia M.D.   On: 05/12/2018 10:40   Dg Hip Unilat With Pelvis 2-3 Views Left  Result Date: 05/12/2018 CLINICAL DATA:  Fall with left hip pain. EXAM: DG HIP (WITH OR WITHOUT PELVIS) 2-3V LEFT COMPARISON:  None. FINDINGS: No pelvic fracture or diastasis. Left hip views are limited by internal rotation. No evidence of left hip fracture or dislocation. Mild osteoarthritis in the weight-bearing portion of both hip joints. Degenerative changes in the visualized lower lumbar spine. No suspicious focal osseous lesions. No radiopaque foreign body.  IMPRESSION: Limited views due to internal rotation in the left hip. No evidence of fracture. No left hip dislocation. If the patient's symptoms persist/remain unexplained, repeat left hip radiographs with improved positioning or left hip CT or MRI could be obtained as clinically warranted. Electronically Signed   By: Ilona Sorrel M.D.   On: 05/12/2018 10:40    EKG:   Normal sinus rhythm 89 bpm no acute ST-T wave changes.  IMPRESSION AND PLAN:   1.  Acute kidney injury, hyponatremia.  IV fluid hydration.  Hold losartan and Lasix.  Send off urine osmolarity and urine random sodium.  Continue to monitor closely. 2.  Hyperkalemia secondary to acute kidney injury.  Give IV calcium IV insulin and  sodium bicarb amp.  Check a potassium later. 3.  Uncontrolled type 2 diabetes mellitus.  Increase Lantus insulin to 38 units nightly.  Sliding scale insulin with 3 units prior to meals.  Diabetic diet.  Need to see if this patient is able to inject insulin on his own. 4.  Essential hypertension.  Blood pressure on the lower side.  Hold antihypertensives for right now.  Can restart Norvasc and/or metoprolol blood pressure starts creeping up. 5.  Hyperlipidemia unspecified on atorvastatin.  Check a CPK. 6.  Weakness and fall.  Physical therapy evaluation.   All the records are reviewed and case discussed with ED provider. Management plans discussed with the patient, family and they are in agreement.  CODE STATUS: Full code  TOTAL TIME TAKING CARE OF THIS PATIENT: 50 minutes.    Loletha Grayer M.D on 05/12/2018 at 1:24 PM  Between 7am to 6pm - Pager - [phone]  After 6pm call admission pager [phone]  [facility]  [phone]  CC: Primary care physician; Juluis Pitch, MD

## 2018-05-12 NOTE — Progress Notes (Signed)
Patient ID: Steven Phelps, male   DOB: 1931-05-31, 84 y.o.   MRN: [id number]  ACP note. Patient and son at the bedside.  Diagnosis: Acute kidney injury, hyponatremia, hyperkalemia, uncontrolled type 2 diabetes mellitus, hypertension, hyperlipidemia and weakness with fall.  CODE STATUS discussed at length.  Patient would like to be a full code at this time.  When he went through his heart valve surgery he had a different decision at that time as per the son.  The patient's son states is his decision at this point.  Time spent on ACP discussion 17 minutes  Dr. Loletha Grayer.

## 2018-05-12 NOTE — ED Notes (Signed)
Son, Thierry Dobosz at [phone] called and notified that pt was here in the ER. Also spoke of the legal guardian listed in the chart and son reported that he has never heard of the person listed, will update chart when son arrives. Son also states that last night they took pizza to him for dinner and noticed that he seemed somewhat confused at that time and his fsbs was in the 300's. Son reports that this abnormal and is suspicious of uti

## 2018-05-12 NOTE — ED Provider Notes (Signed)
[facility] Emergency Department Provider Note  ____________________________________________   First MD Initiated Contact with Patient 05/12/18 [phone]     (approximate)  I have reviewed the triage vital signs and the nursing notes.   HISTORY  Chief Complaint Fall   HPI Steven Phelps is a 84 y.o. male who comes to the emergency department via EMS after being found on the floor of his nursing home this morning.  Staff at the nursing home was attempting to get the patient up to go to breakfast but when they went into check on him he was lying on the floor on his left side reporting moderate to severe left rib pain, left wrist pain, and left knee pain.  Apparently the patient was quite confused.  The patient was only in the nursing home for 1 day.  There was a strong odor of urine reported by EMS and his blood sugar was 437 prior to arrival.  The patient himself is a challenging historian and says he is not sure how he ended up on the ground.  He is not sure if he fell or passed out.  He does report sharp left-sided chest pain worse when taking a deep breath improved with resting.  No fevers or chills.  The pain is nonradiating.  He was unable to get up and he says his left knee "hurts".  Past Medical History:  Diagnosis Date  . Arthritis   . Atrial fibrillation ([place])   . Cancer ([place])    skin  . Diabetes mellitus without complication ([place])   . Glaucoma (increased eye pressure)   . Heart murmur   . HLD (hyperlipidemia)   . Hypertension   . Shortness of breath dyspnea     Patient Active Problem List   Diagnosis Date Noted  . Acute kidney injury ([place]) 05/12/2018  . Acute respiratory failure with hypoxia ([place]) 11/22/2015  . Type 2 diabetes mellitus ([place]) 11/22/2015  . HTN (hypertension) 11/22/2015  . HLD (hyperlipidemia) 11/22/2015  . A-fib ([place]) 11/22/2015  . CAP (community acquired pneumonia) 11/22/2015  . 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 Total knee  replacement status 06/03/2015    Past Surgical History:  Procedure Laterality Date  . AORTIC VALVE REPLACEMENT  2010  . CARDIAC CATHETERIZATION    . CHOLECYSTECTOMY    . EYE SURGERY Bilateral    cataract extraction  . TONSILLECTOMY    . TOTAL KNEE ARTHROPLASTY Left 06/03/2015   Procedure: TOTAL KNEE ARTHROPLASTY;  Surgeon: Dereck Leep, MD;  Location: [facility] ORS;  Service: Orthopedics;  Laterality: Left;    Prior to Admission medications   Medication Sig Start Date End Date Taking? Authorizing Provider  amLODipine (NORVASC) 5 MG tablet Take 5 mg by mouth daily.    Yes [provider]  aspirin 81 MG tablet Take 1 tablet (81 mg total) by mouth daily. 06/20/15  Yes Watt Climes, PA  atorvastatin (LIPITOR) 40 MG tablet Take 40 mg by mouth daily at 6 PM.   Yes [provider]  furosemide (LASIX) 20 MG tablet Take 20 mg by mouth 2 (two) times daily.    Yes [provider]  glipiZIDE (GLUCOTROL) 10 MG tablet Take 10 mg by mouth 2 (two) times daily before a meal.   Yes [provider]  insulin glargine (LANTUS) 100 unit/mL SOPN Inject 30 Units into the skin daily.    Yes [provider]  latanoprost (XALATAN) 0.005 % ophthalmic solution Place 1 drop into both  eyes at bedtime.   Yes [provider]  losartan (COZAAR) 50 MG tablet Take 50 mg by mouth 2 (two) times daily.   Yes [provider]  metoprolol (LOPRESSOR) 100 MG tablet Take 100 mg by mouth 2 (two) times daily.   Yes [provider]    Allergies Synvisc [hylan g-f 20] and Zocor [simvastatin]  Family History  Problem Relation Age of Onset  . Diabetes Father   . CAD Mother     Social History Social History   Tobacco Use  . Smoking status: Former Smoker    Packs/day: 0.50    Types: Cigarettes  . Smokeless tobacco: Never Used  Substance Use Topics  . Alcohol use: Yes    Comment: 3 drinks a day  . Drug use: No    Review of Systems Constitutional: No  fever/chills Eyes: No visual changes. ENT: No sore throat. Cardiovascular: Positive for chest pain. Respiratory: Positive for shortness of breath. Gastrointestinal: No abdominal pain.  No nausea, no vomiting.  No diarrhea.  No constipation. Genitourinary: Negative for dysuria. Musculoskeletal: Positive for knee pain Skin: Negative for rash. Neurological: Negative for headaches, focal weakness or numbness.   ____________________________________________   PHYSICAL EXAM:  VITAL SIGNS: ED Triage Vitals  Enc Vitals Group     BP      Pulse      Resp      Temp      Temp src      SpO2      Weight      Height      Head Circumference      Peak Flow      Pain Score      Pain Loc      Pain Edu?      Excl. in [place]?     Constitutional: Pleasantly confused.  Alert and oriented x2 to his name and location but not sure of the year or exactly what happened today Eyes: PERRL EOMI. midrange and breast Head: Atraumatic. Nose: No congestion/rhinnorhea. Mouth/Throat: No trismus Neck: No stridor.  No midline tenderness or step-offs Cardiovascular: Normal rate, regular rhythm. Grossly normal heart sounds.  Good peripheral circulation.  Chest wall is tender laterally on the left with no crepitus Respiratory: Normal respiratory effort.  No retractions. Lungs CTAB and moving good air Gastrointestinal: Soft nontender Musculoskeletal: Some discomfort when ranging left hip.  Legs are equal in size with no rotation Neurologic:  . No gross focal neurologic deficits are appreciated. Skin:  Skin is warm, dry and intact. No rash noted. Psychiatric: Encephalopathic    ____________________________________________   DIFFERENTIAL includes but not limited to  Intracerebral hemorrhage, stroke, metabolic derangement, UTI, hip fracture ____________________________________________   LABS (all labs ordered are listed, but only abnormal results are displayed)  Labs Reviewed  COMPREHENSIVE METABOLIC  PANEL - Abnormal; Notable for the following components:      Result Value   Sodium 129 (*)    Potassium 6.1 (*)    Chloride 100 (*)    CO2 19 (*)    Glucose, Bld 468 (*)    BUN 69 (*)    Creatinine, Ser 2.42 (*)    Calcium 8.6 (*)    ALT 13 (*)    GFR calc non Af Amer 23 (*)    GFR calc Af Amer 27 (*)    All other components within normal limits  CBC WITH DIFFERENTIAL/PLATELET - Abnormal; Notable for the following components:   RBC 4.02 (*)  Hemoglobin 12.3 (*)    HCT 36.7 (*)    All other components within normal limits  URINALYSIS, COMPLETE (UACMP) WITH MICROSCOPIC - Abnormal; Notable for the following components:   Color, Urine YELLOW (*)    APPearance CLEAR (*)    Glucose, UA >=500 (*)    Hgb urine dipstick SMALL (*)    All other components within normal limits  HEMOGLOBIN A1C - Abnormal; Notable for the following components:   Hgb A1c MFr Bld 12.4 (*)    All other components within normal limits  BASIC METABOLIC PANEL - Abnormal; Notable for the following components:   Sodium 133 (*)    Glucose, Bld 337 (*)    BUN 56 (*)    Creatinine, Ser 1.76 (*)    GFR calc non Af Amer 34 (*)    GFR calc Af Amer 39 (*)    All other components within normal limits  CK - Abnormal; Notable for the following components:   Total CK 47 (*)    All other components within normal limits  GLUCOSE, CAPILLARY - Abnormal; Notable for the following components:   Glucose-Capillary 314 (*)    All other components within normal limits  GLUCOSE, CAPILLARY - Abnormal; Notable for the following components:   Glucose-Capillary 274 (*)    All other components within normal limits  GLUCOSE, CAPILLARY - Abnormal; Notable for the following components:   Glucose-Capillary 232 (*)    All other components within normal limits  GLUCOSE, CAPILLARY - Abnormal; Notable for the following components:   Glucose-Capillary 192 (*)    All other components within normal limits  TROPONIN I  BRAIN NATRIURETIC  PEPTIDE  SODIUM, URINE, RANDOM  OSMOLALITY, URINE    Lab work reviewed by me with a number of abnormalities most notably elevated creatinine concerning for acute kidney injury __________________________________________  EKG  ED ECG REPORT I, Darel Hong, the attending physician, personally viewed and interpreted this ECG.  Date: 05/12/2018 EKG Time:  Rate: 89 Rhythm: normal sinus rhythm QRS Axis: normal Intervals: normal ST/T Wave abnormalities: normal Narrative Interpretation: no evidence of acute ischemia  ____________________________________________  RADIOLOGY  Head CT and pelvis CTs reviewed by me with no acute disease Chest x-ray, hip x-ray, knee x-ray, and wrist x-rays reviewed by me with chronic changes but no acute disease ____________________________________________   PROCEDURES  Procedure(s) performed: no  Procedures  Critical Care performed: no  Observation: no ____________________________________________   INITIAL IMPRESSION / ASSESSMENT AND PLAN / ED COURSE  Pertinent labs & imaging results that were available during my care of the patient were reviewed by me and considered in my medical decision making (see chart for details).  Differential is extremely broad on a confused elderly patient with a syncopal episode versus fall.  His knee pain raises concern for possible hip fracture so x-rays are pending along with broad labs including urinalysis.  The patient's x-ray is technically difficult so in addition to a head CT will add on a pelvis CT to evaluate for occult fracture.  Patient's imaging is negative for acute pathology although he remains confused.  He has acute kidney injury raising concern for prolonged downtime and dehydration.  At this point as he is not back to his normal mental status he requires inpatient admission for continued fluid hydration and evaluation and treatment of his altered mental status.  I discussed with the hospitalist  who has graciously agreed to admit the patient to his service.      ____________________________________________  FINAL CLINICAL IMPRESSION(S) / ED DIAGNOSES  Final diagnoses:  Azotemia  Acute kidney injury ([place])  Encephalopathy      NEW MEDICATIONS STARTED DURING THIS VISIT:  Current Discharge Medication List       Note:  This document was prepared using Dragon voice recognition software and may include unintentional dictation errors.     Darel Hong, MD 05/13/18 1158

## 2018-05-12 NOTE — Clinical Social Work Note (Signed)
CSW received a consult that the patient admitted from [facility]. The CSW will assess when able.  Santiago Bumpers, MSW, Latanya Presser [phone]

## 2018-05-12 NOTE — Progress Notes (Signed)
PT Cancellation Note  Patient Details Name: Steven Phelps MRN: [id number] DOB: [birth year]   Cancelled Treatment:    Reason Eval/Treat Not Completed: Medical issues which prohibited therapy.  Potassium 6.1.  Per protocol, will hold PT until pt more medically appropriate for exertional activity.     Collie Siad PT, DPT 05/12/2018, 2:25 PM

## 2018-05-12 NOTE — ED Notes (Signed)
Attempted to call report, RN will call me back.

## 2018-05-12 NOTE — ED Triage Notes (Signed)
Pt arrives via ems from [facility], pt found on floor this am after staff attempted to get him to come to breakfast but he refused, when pt was checked on again he was found in the floor. Pt is c/o left rib pain, left wrist pain, right knee pain, fsbs of 437. EMS reports some confusion uncertain if this is pt's baseline, strong odor of urine reported by Ems

## 2018-05-13 ENCOUNTER — Encounter
Admission: EM | Disposition: A | Discharge status: Home / Self Care | Payer: Self-pay | Source: Home / Self Care | Attending: Internal Medicine

## 2018-05-13 LAB — GLUCOSE, CAPILLARY
GLUCOSE-CAPILLARY: 192 mg/dL — AB (ref 65–99)
GLUCOSE-CAPILLARY: 195 mg/dL — AB (ref 65–99)
GLUCOSE-CAPILLARY: 231 mg/dL — AB (ref 65–99)
Glucose-Capillary: 232 mg/dL — ABNORMAL HIGH (ref 65–99)

## 2018-05-13 SURGERY — Surgical Case
Anesthesia: *Unknown

## 2018-05-13 SURGERY — HEMIARTHROPLASTY, HIP, DIRECT ANTERIOR APPROACH, FOR FRACTURE
Anesthesia: Spinal | Laterality: Left

## 2018-05-13 MED ORDER — METOPROLOL TARTRATE 25 MG PO TABS
25.0000 mg | ORAL_TABLET | Freq: Two times a day (BID) | ORAL | Status: DC
  Administered 2018-05-13 – 2018-05-14 (×3): 25 mg via ORAL
  Filled 2018-05-13 (×3): qty 1

## 2018-05-13 NOTE — Clinical Social Work Note (Signed)
Clinical Social Work Assessment  Patient Details  Name: Steven Phelps MRN: [id number] Date of Birth: 01-03-1931  Date of referral:  05/13/18               Reason for consult:  Facility Placement                Permission sought to share information with:  Chartered certified accountant granted to share information::  Yes, Verbal Permission Granted  Name::        Agency::  [facility]  Relationship::     Contact Information:     Housing/Transportation Living arrangements for the past 2 months:  [place] of Information:  Patient, Medical Team, Adult Children Patient Interpreter Needed:  None Criminal Activity/Legal Involvement Pertinent to Current Situation/Hospitalization:  No - Comment as needed Significant Relationships:  Adult Children, Community Support Lives with:  Facility Resident Do you feel safe going back to the place where you live?  Yes Need for family participation in patient care:  Yes (Comment)(Patient is confused and oriented X1)  Care giving concerns:  Patient admitted from an ALF   Social Worker assessment / plan: The CSW met with the patient at bedside where he was eating breakfast while sitting in the recliner. The patient was [facility] and seemed confused about where he is per the unit secretary (he had ordered her to leave his house when she came in to check on him). The patient was alert, but unable to answer the day of the week, his current location, and his reason for being in the hospital. The patient also believes that he admitted from his son's home rather than an ALF. The patient did verbalize permission to call his son.  The CSW contacted Quillian Quince who confirmed that the patient has been at [facility] for 1 day prior to admission. Daniel plans to transport the patient himself at discharge. The patient usually uses a cane to ambulate and has intermittent confusion at baseline. The CSW advised Quillian Quince of SW's role and care.    The CSW will continue to follow for discharge facilitation.  Employment status:  Retired Nurse, adult PT Recommendations:  Not assessed at this time Information / Referral to community resources:     Patient/Family's Response to care:  The patient was pleasantly confused. The patient's son thanked the CSW for assistance.  Patient/Family's Understanding of and Emotional Response to Diagnosis, Current Treatment, and Prognosis:  The patient has little understanding of the current treatment; however, his son is involved and understands the discharge plan.  Emotional Assessment Appearance:  Appears stated age Attitude/Demeanor/Rapport:  Engaged, Self-Confident, Gracious, Charismatic Affect (typically observed):  Appropriate, Pleasant Orientation:  Oriented to Self Alcohol / Substance use:  Never Used Psych involvement (Current and /or in the community):  No (Comment)  Discharge Needs  Concerns to be addressed:  Care Coordination, Discharge Planning Concerns Readmission within the last 30 days:  No Current discharge risk:  None Barriers to Discharge:  Continued Medical Work up   [employer], LCSW 05/13/2018, 11:26 AM

## 2018-05-13 NOTE — Progress Notes (Signed)
Sat up in chair most of day. Pleasant, disoriented to place/time.

## 2018-05-13 NOTE — Evaluation (Signed)
Physical Therapy Evaluation Patient Details Name: Steven Phelps MRN: [id number] DOB: [birth year] Today's Date: 05/13/2018   History of Present Illness  Pt is a 84 y/o M who presented after a fall.  Pt had some rib and knee pain following but this has since resolved.  Imaging of pelvis, head, chest, knee, hip, wrist all negative.  In the ED the pt was found to have an AKI with hyponatremia and hyperkalemia. Pt's PMH includes a-fib, aortic valve replacement, L TKA.      Clinical Impression  Pt admitted with above diagnosis. Pt currently with functional limitations due to the deficits listed below (see PT Problem List). Pt reports that at baseline he is independent with all ADLs and IADLs and ambulates with his SPC.  No family present to confirm this information.  Pt ambulated with min guard assist using SPC this session but pt does demonstrate some balance deficits with higher level activities. Pt will benefit from skilled PT to increase their independence and safety with mobility to allow discharge to the venue listed below.      Follow Up Recommendations Outpatient PT    Equipment Recommendations  None recommended by PT    Recommendations for Other Services       Precautions / Restrictions Precautions Precautions: Fall Restrictions Weight Bearing Restrictions: No      Mobility  Bed Mobility               General bed mobility comments: Pt sitting in chair at start and end of session  Transfers Overall transfer level: Needs assistance Equipment used: Straight cane Transfers: Sit to/from Stand Sit to Stand: Min guard         General transfer comment: Pt has difficulty standing from chair and takes multiple attempts to boost to standing.  Pt demonstrates safe technique with proper hand placement.   Ambulation/Gait Ambulation/Gait assistance: Min guard Ambulation Distance (Feet): 160 Feet Assistive device: Straight cane Gait Pattern/deviations: Decreased step length -  right;Decreased step length - left Gait velocity: decreased   General Gait Details: Pt ambulates next to railing on wall but does not reach out for it.  When he comes across an obstacle he stops completely and navigates hesitantly around obstacle before ambulating past.  No signs of instability but pt has difficulty with higher level balance activities while ambulating.   Stairs            Wheelchair Mobility    Modified Rankin (Stroke Patients Only)       Balance Overall balance assessment: Needs assistance Sitting-balance support: No upper extremity supported;Feet supported Sitting balance-Leahy Scale: Good     Standing balance support: No upper extremity supported;During functional activity Standing balance-Leahy Scale: Fair Standing balance comment: Pt able to stand briefly without UE support but relies on at least 1UE support for dynamic activities.                  Standardized Balance Assessment Standardized Balance Assessment : Dynamic Gait Index   Dynamic Gait Index Level Surface: Mild Impairment Change in Gait Speed: Mild Impairment Gait with Horizontal Head Turns: Mild Impairment Gait with Vertical Head Turns: Mild Impairment Gait and Pivot Turn: Mild Impairment Step Over Obstacle: Mild Impairment Step Around Obstacles: Moderate Impairment       Pertinent Vitals/Pain Pain Assessment: No/denies pain    Home Living Family/patient expects to be discharged to:: Private residence Living Arrangements: Children(son?) Available Help at Discharge: Family;Available PRN/intermittently Type of Home: House Home Access: Stairs to  enter Entrance Stairs-Rails: None   Home Layout: One level Home Equipment: Cane - single point Additional Comments: Pt reports that he is living at home with his son but hoping to move to [facility] ALF in the next week or so.  Per chart review, the pt is already living at [place].     Prior Function Level of  Independence: Independent with assistive device(s)         Comments: Pt ambulates with SPC at baseline and denies any falls in the past 6 months.  Pt reports he is independent with all ADLs and IADLs.  Still driving.  Unsure of accuracy of information as no family present to confirm.      Hand Dominance        Extremity/Trunk Assessment   Upper Extremity Assessment Upper Extremity Assessment: (BUE strength grossly 4-/5)    Lower Extremity Assessment Lower Extremity Assessment: (BLE strength grossly 4/5)       Communication   Communication: HOH  Cognition Arousal/Alertness: Awake/alert Behavior During Therapy: WFL for tasks assessed/performed Overall Cognitive Status: No family/caregiver present to determine baseline cognitive functioning                                 General Comments: Pt says that he lives alone but then says he lives with his son (posed more like a question as if he doesn't know for sure).  No family present to confirm. Unsure of pt's baseline cognition.  Pt not oriented to date (says it's 1919 and unable to provide month).       General Comments      Exercises General Exercises - Lower Extremity Long Arc Quad: AROM;Both;10 reps;Seated   Assessment/Plan    PT Assessment Patient needs continued PT services  PT Problem List Decreased strength;Decreased balance;Decreased cognition;Decreased safety awareness       PT Treatment Interventions DME instruction;Gait training;Therapeutic activities;Therapeutic exercise;Stair training;Neuromuscular re-education;Balance training;Cognitive remediation;Patient/family education    PT Goals (Current goals can be found in the Care Plan section)  Acute Rehab PT Goals Patient Stated Goal: to return home PT Goal Formulation: With patient Time For Goal Achievement: 05/27/18 Potential to Achieve Goals: Good    Frequency Min 2X/week   Barriers to discharge        Co-evaluation                AM-PAC PT "6 Clicks" Daily Activity  Outcome Measure Difficulty turning over in bed (including adjusting bedclothes, sheets and blankets)?: None Difficulty moving from lying on back to sitting on the side of the bed? : None Difficulty sitting down on and standing up from a chair with arms (e.g., wheelchair, bedside commode, etc,.)?: A Little Help needed moving to and from a bed to chair (including a wheelchair)?: A Little Help needed walking in hospital room?: A Little Help needed climbing 3-5 steps with a railing? : A Little 6 Click Score: 20    End of Session Equipment Utilized During Treatment: Gait belt Activity Tolerance: Patient tolerated treatment well Patient left: in chair;with call bell/phone within reach;with chair alarm set Nurse Communication: Mobility status PT Visit Diagnosis: Unsteadiness on feet (R26.81);Muscle weakness (generalized) (M62.81);Other abnormalities of gait and mobility (R26.89)    Time: 8502-7741 PT Time Calculation (min) (ACUTE ONLY): 17 min   Charges:   PT Evaluation $PT Eval Low Complexity: 1 Low     PT G Codes:  Collie Siad PT, DPT 05/13/2018, 1:30 PM

## 2018-05-13 NOTE — Progress Notes (Signed)
[facility] [place] NAME: Ziquan Fidel    MR#:  [id number]  DATE OF BIRTH:  1931/03/30  SUBJECTIVE:  CHIEF COMPLAINT:   Chief Complaint  Patient presents with  . Fall    REVIEW OF SYSTEMS:  Review of Systems  Constitutional: Positive for malaise/fatigue. Negative for chills and fever.  HENT: Positive for hearing loss. Negative for congestion, ear discharge and nosebleeds.   Eyes: Negative for blurred vision and double vision.  Respiratory: Negative for cough, shortness of breath and wheezing.   Cardiovascular: Negative for chest pain, palpitations and leg swelling.  Gastrointestinal: Negative for abdominal pain, constipation, diarrhea, nausea and vomiting.  Genitourinary: Negative for dysuria.  Musculoskeletal: Negative for myalgias.  Neurological: Negative for dizziness, focal weakness, seizures, weakness and headaches.  Psychiatric/Behavioral: Negative for depression.    DRUG ALLERGIES:   Allergies  Allergen Reactions  . Synvisc [Hylan G-F 20] Other (See Comments)    "severe pain in knee"  . Zocor [Simvastatin] Other (See Comments)    "muscle aches in joints"    VITALS:  Blood pressure (!) 149/54, pulse 84, temperature 97.7 F (36.5 C), temperature source Oral, resp. rate 20, height 5\' 8"  (1.727 m), weight 95.3 kg (210 lb), SpO2 98 %.  PHYSICAL EXAMINATION:  Physical Exam  GENERAL:  84 y.o.-year-old patient lying in the bed with no acute distress.  EYES: Pupils equal, round, reactive to light and accommodation. No scleral icterus. Extraocular muscles intact.  HEENT: Head atraumatic, normocephalic. Oropharynx and nasopharynx clear.  NECK:  Supple, no jugular venous distention. No thyroid enlargement, no tenderness.  LUNGS: Normal breath sounds bilaterally, no wheezing, rales,rhonchi or crepitation. No use of accessory muscles of respiration.  CARDIOVASCULAR: S1, S2 normal. No  rubs, or gallops.  3/6 systolic murmur is  present  ABDOMEN: Soft, nontender, nondistended. Bowel sounds present. No organomegaly or mass.  EXTREMITIES: No pedal edema, cyanosis, or clubbing.  NEUROLOGIC: Cranial nerves II through XII are intact. Muscle strength 5/5 in all extremities. Sensation intact. Gait not checked.  Global weakness present PSYCHIATRIC: The patient is alert and oriented x 2.  Intermittent confusion noted SKIN: No obvious rash, lesion, or ulcer.    LABORATORY PANEL:   CBC Recent Labs  Lab 05/12/18 0944  WBC 7.8  HGB 12.3*  HCT 36.7*  PLT 219   ------------------------------------------------------------------------------------------------------------------  Chemistries  Recent Labs  Lab 05/12/18 0944 05/12/18 1841  NA 129* 133*  K 6.1* 4.2  CL 100* 103  CO2 19* 22  GLUCOSE 468* 337*  BUN 69* 56*  CREATININE 2.42* 1.76*  CALCIUM 8.6* 8.9  AST 15  --   ALT 13*  --   ALKPHOS 112  --   BILITOT 0.7  --    ------------------------------------------------------------------------------------------------------------------  Cardiac Enzymes Recent Labs  Lab 05/12/18 0944  TROPONINI <0.03   ------------------------------------------------------------------------------------------------------------------  RADIOLOGY:  Dg Chest 1 View  Result Date: 05/12/2018 CLINICAL DATA:  Pain in the left chest wall EXAM: CHEST  1 VIEW COMPARISON:  11/22/2015 FINDINGS: Chronic airway thickening. Mild scarring or atelectasis at the left base. Status post aortic valve replacement. Normal heart size and aortic contours. 15 mm nodular density at the right apex. No acute osseous finding. Glenohumeral osteoarthritis on the right. IMPRESSION: 1. No evidence of active disease. 2. Nodular density at the right apex, recommend noncontrast chest CT when appropriate. 3. COPD. Electronically Signed   By: Monte Fantasia M.D.   On: 05/12/2018 10:39   Dg Wrist Complete  Left  Result Date: 05/12/2018 CLINICAL DATA:  Pain following  fall EXAM: LEFT WRIST - COMPLETE 3+ VIEW COMPARISON:  None. FINDINGS: Frontal, oblique, and lateral views were obtained. There is no evident acute fracture or dislocation. There is advanced osteoarthritis in the lateral radial carpal region with remodeling of the radial styloid and scaphoid region. There is moderate osteoarthritic change in the mid carpal row as well. No erosive changes. IMPRESSION: Osteoarthritic change in the proximal mid wrist regions, most severe laterally in the proximal carpal row region with remodeling of the radial styloid and scaphoid. Question old trauma residua in this area. No acute fracture or dislocation evident.  No erosive changes. Electronically Signed   By: Lowella Grip III M.D.   On: 05/12/2018 10:39   Ct Head Wo Contrast  Result Date: 05/12/2018 CLINICAL DATA:  Unwitnessed fall with confusion EXAM: CT HEAD WITHOUT CONTRAST TECHNIQUE: Contiguous axial images were obtained from the base of the skull through the vertex without intravenous contrast. COMPARISON:  None. FINDINGS: Brain: There is moderate diffuse atrophy. There is no intracranial mass, hemorrhage, extra-axial fluid collection, or midline shift. There is patchy small vessel disease in the centra semiovale bilaterally. No acute infarct is evident. Vascular: No vascular calcifications are appreciable. There is calcification in each distal vertebral artery as well as in each carotid siphon region. Skull: There is a defect in the anterior left temporal bone, likely of postoperative etiology. Bony calvarium elsewhere intact. Sinuses/Orbits: There is mild mucosal thickening in the right maxillary antrum. There is mucosal thickening in several ethmoid air cells bilaterally. Other visualized paranasal sinuses are clear. Orbits appear symmetric bilaterally. Other: Mastoids on the left are clear. There is opacification in multiple mastoid air cells on the right. IMPRESSION: Atrophy with patchy small vessel disease in the  periventricular white matter. No acute infarct. No mass or hemorrhage. There are foci of arterial vascular calcification. Question postoperative defect anterior left temporal bone. Bony calvarium otherwise appears unremarkable. Right-sided mastoid air cell disease. Areas of paranasal sinus disease as well. Electronically Signed   By: Lowella Grip III M.D.   On: 05/12/2018 11:56   Ct Pelvis Wo Contrast  Result Date: 05/12/2018 CLINICAL DATA:  Fall today. Severe left hip pain. Insufficient hip radiographs. Initial encounter. EXAM: CT PELVIS WITHOUT CONTRAST TECHNIQUE: Multidetector CT imaging of the pelvis was performed following the standard protocol without intravenous contrast. COMPARISON:  None. FINDINGS: Urinary Tract: Unremarkable urinary bladder. Bowel: Mild diverticulosis of visualized portion of descending and sigmoid colon. No evidence of diverticulitis in these regions. Vascular/Lymphatic: No pathologically enlarged lymph nodes or other significant abnormality. Reproductive:  No mass or other significant abnormality. Other: No evidence of pelvic hematoma or hemoperitoneum. Musculoskeletal: No evidence of acute hip or pelvic fracture. Old fracture deformity of the left pubis noted. IMPRESSION: No evidence of hip or pelvic fracture, or other acute findings. Electronically Signed   By: Earle Gell M.D.   On: 05/12/2018 11:57   Dg Knee Complete 4 Views Left  Result Date: 05/12/2018 CLINICAL DATA:  Fall onto left chest with pain.  Left knee pain. EXAM: LEFT KNEE - COMPLETE 4+ VIEW COMPARISON:  06/03/2015 FINDINGS: Total knee arthroplasty is well seated. Small joint effusion. No fracture or malalignment. Osteopenia and atherosclerosis. IMPRESSION: 1. Small joint effusion without fracture. 2. Total knee arthroplasty. Electronically Signed   By: Monte Fantasia M.D.   On: 05/12/2018 10:40   Dg Hip Unilat With Pelvis 2-3 Views Left  Result Date: 05/12/2018 CLINICAL DATA:  Fall  with left hip pain. EXAM:  DG HIP (WITH OR WITHOUT PELVIS) 2-3V LEFT COMPARISON:  None. FINDINGS: No pelvic fracture or diastasis. Left hip views are limited by internal rotation. No evidence of left hip fracture or dislocation. Mild osteoarthritis in the weight-bearing portion of both hip joints. Degenerative changes in the visualized lower lumbar spine. No suspicious focal osseous lesions. No radiopaque foreign body. IMPRESSION: Limited views due to internal rotation in the left hip. No evidence of fracture. No left hip dislocation. If the patient's symptoms persist/remain unexplained, repeat left hip radiographs with improved positioning or left hip CT or MRI could be obtained as clinically warranted. Electronically Signed   By: Ilona Sorrel M.D.   On: 05/12/2018 10:40    EKG:   Orders placed or performed during the hospital encounter of 05/12/18  . ED EKG  . ED EKG  . EKG 12-Lead  . EKG 12-Lead  . EKG 12-Lead  . EKG 12-Lead    ASSESSMENT AND PLAN:   84 year old male with past medical history significant for mild cognitive decline, hypertension, diabetes, A. fib not on any anticoagulation presents to hospital secondary to fall and weakness.  1.  Hyponatremia-secondary to hypovolemic hyponatremia.  Lasix on hold -Gentle IV fluids and monitor.  Improving sodium  2.  Acute renal failure-prerenal causes, hold nephrotoxins.  Recently started on losartan. -hold Lasix and losartan and monitor -Improving renal function with fluids  3.  Hyperkalemia-secondary to losartan and renal failure.  Improved after treatment  4.  Diabetes mellitus-restarted Lantus, sliding scale insulin. -Hold glipizide while in the hospital  5.  Hypertension-metoprolol, Norvasc, losartan and Lasix on hold due to low normal blood pressures on admission.  Restart metoprolol at a lower dose today.  6.  DVT prophylaxis-Lovenox    Assisted living facility.  Likely discharge back to [facility] at discharge. -Ambulates with a cane at  baseline    All the records are reviewed and case discussed with Care Management/Social Workerr. Management plans discussed with the patient, family and they are in agreement.  CODE STATUS: Full Code  TOTAL TIME TAKING CARE OF THIS PATIENT: 38 minutes.   POSSIBLE D/C IN 1-2 DAYS, DEPENDING ON CLINICAL CONDITION.   Gladstone Lighter M.D on 05/13/2018 at 12:43 PM  Between 7am to 6pm - Pager - [phone]  After 6pm go to [email or web address] - password [email or web address] [place] [facility]  Office  [phone]  CC: Primary care physician; Juluis Pitch, MD

## 2018-05-14 LAB — GLUCOSE, CAPILLARY
Glucose-Capillary: 115 mg/dL — ABNORMAL HIGH (ref 65–99)
Glucose-Capillary: 222 mg/dL — ABNORMAL HIGH (ref 65–99)

## 2018-05-14 LAB — BASIC METABOLIC PANEL
Anion gap: 6 (ref 5–15)
BUN: 35 mg/dL — AB (ref 6–20)
CHLORIDE: 106 mmol/L (ref 101–111)
CO2: 24 mmol/L (ref 22–32)
CREATININE: 1.04 mg/dL (ref 0.61–1.24)
Calcium: 9.1 mg/dL (ref 8.9–10.3)
GFR calc Af Amer: >60 mL/min (ref >60)
GFR calc non Af Amer: >60 mL/min (ref >60)
Glucose, Bld: 137 mg/dL — ABNORMAL HIGH (ref 65–99)
Potassium: 4.8 mmol/L (ref 3.5–5.1)
SODIUM: 136 mmol/L (ref 135–145)

## 2018-05-14 MED ORDER — LOSARTAN POTASSIUM 50 MG PO TABS: 50.0000 mg | ORAL_TABLET | Freq: Every day | ORAL | 1 refills | Status: DC

## 2018-05-14 MED ORDER — INSULIN GLARGINE 100 UNITS/ML SOLOSTAR PEN: 36.0000 [IU] | PEN_INJECTOR | Freq: Every day | SUBCUTANEOUS | 11 refills | Status: DC

## 2018-05-14 MED ORDER — FUROSEMIDE 20 MG PO TABS: 20.0000 mg | ORAL_TABLET | Freq: Every day | ORAL | 1 refills | Status: AC

## 2018-05-14 NOTE — Clinical Social Work Note (Signed)
MD to discharge patient today back to [facility]. CSW spoke with Vaughan Basta at [facility] and she can take patient back. CSW contacted patient's son and he will transport patient back to [facility].  Shela Leff MSW,LCSW [phone]

## 2018-05-14 NOTE — Progress Notes (Signed)
Inpatient Diabetes Program Recommendations  AACE/ADA: New Consensus Statement on Inpatient Glycemic Control (2015)  Target Ranges:  Prepandial:   less than 140 mg/dL      Peak postprandial:   less than 180 mg/dL (1-2 hours)      Critically ill patients:  140 - 180 mg/dL   Results for [name], [name] (MRN [id number]) as of 05/14/2018 10:06  Ref. Range 05/13/2018 07:40 05/13/2018 11:50 05/13/2018 16:40 05/13/2018 20:51  Glucose-Capillary Latest Ref Range: 65 - 99 mg/dL 232 (H) 192 (H) 195 (H) 231 (H)   Results for [name], [name] (MRN [id number]) as of 05/14/2018 10:06  Ref. Range 05/14/2018 07:37  Glucose-Capillary Latest Ref Range: 65 - 99 mg/dL 115 (H)    Admit with: Acute Kidney Injury/ Hyponatremia/ Hyperkalemia   History: DM, Cognitive Decline  Home DM Meds: Lantus 30 units daily       Glipizide 10 mg BID  Current Insulin Orders: Lantus 38 units QHS      Novolog Sensitive Correction Scale/ SSI (0-9 units) TID AC     Lives at [facility].  Mild confusion noted on nursing assessment.  CBG improved this AM: 115 mg/dl.     --Will follow patient during hospitalization--  Wyn Quaker RN, MSN, CDE Diabetes Coordinator Inpatient Glycemic Control Team Team Pager: [phone] (8a-5p)

## 2018-05-14 NOTE — NC FL2 (Signed)
[place] LEVEL OF CARE SCREENING TOOL     IDENTIFICATION  Patient Name: Steven Phelps Birthdate: [birth year] Sex: male Admission Date (Current Location): 05/12/2018  [place] and [place] Number:  Engineering geologist and Address:  [facility], [address] [place], [place] [address]      Provider Number: [id number]  Attending Physician Name and Address:  Saundra Shelling, MD  Relative Name and Phone Number:  Damont Balles (son) [phone] or [phone]    Current Level of Care: Hospital Recommended Level of Care: [place] Prior Approval Number:    Date Approved/Denied:   PASRR Number: [id number]  Discharge Plan: Domiciliary (Rest home)    Current Diagnoses: Patient Active Problem List   Diagnosis Date Noted  . Acute kidney injury ([place]) 05/12/2018  . Acute respiratory failure with hypoxia ([place]) 11/22/2015  . Type 2 diabetes mellitus ([place]) 11/22/2015  . HTN (hypertension) 11/22/2015  . HLD (hyperlipidemia) 11/22/2015  . A-fib ([place]) 11/22/2015  . CAP (community acquired pneumonia) 11/22/2015  . 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 Total knee replacement status 06/03/2015    Orientation RESPIRATION BLADDER Height & Weight     Self, Place  Normal Incontinent Weight: 210 lb (95.3 kg) Height:  5\' 8"  (172.7 cm)  BEHAVIORAL SYMPTOMS/MOOD NEUROLOGICAL BOWEL NUTRITION STATUS      Incontinent Diet(Carb Modified)  AMBULATORY STATUS COMMUNICATION OF NEEDS Skin   Supervision Verbally Normal                       Personal Care Assistance Level of Assistance  Bathing, Feeding, Dressing Bathing Assistance: Limited assistance Feeding assistance: Limited assistance Dressing Assistance: Limited assistance     Functional Limitations Info  Sight, Hearing, Speech Sight Info: Adequate Hearing Info: Impaired Speech Info: Adequate    SPECIAL CARE FACTORS FREQUENCY                       Contractures Contractures  Info: Not present    Additional Factors Info  Code Status, Allergies Code Status Info: Full Allergies Info: Synvisc Hylan G-f 20, Zocor Simvastatin           Medication List    TAKE these medications   amLODipine 5 MG tablet Commonly known as:  NORVASC Take 5 mg by mouth daily.   aspirin 81 MG tablet Take 1 tablet (81 mg total) by mouth daily.   atorvastatin 40 MG tablet Commonly known as:  LIPITOR Take 40 mg by mouth daily at 6 PM.   furosemide 20 MG tablet Commonly known as:  LASIX Take 1 tablet (20 mg total) by mouth daily. Start taking on:  05/15/2018 What changed:  when to take this   glipiZIDE 10 MG tablet Commonly known as:  GLUCOTROL Take 10 mg by mouth 2 (two) times daily before a meal.   insulin glargine 100 unit/mL Sopn Commonly known as:  LANTUS Inject 0.36 mLs (36 Units total) into the skin daily. What changed:  how much to take   latanoprost 0.005 % ophthalmic solution Commonly known as:  XALATAN Place 1 drop into both eyes at bedtime.   losartan 50 MG tablet Commonly known as:  COZAAR Take 1 tablet (50 mg total) by mouth daily. Start taking on:  05/15/2018 What changed:  when to take this   metoprolol tartrate 100 MG tablet Commonly known as:  LOPRESSOR Take 100 mg by mouth 2 (two) times daily.  Additional Information SS# [id number]  Shela Leff, [place]

## 2018-05-14 NOTE — Discharge Summary (Addendum)
[place] at [place] NAME: Steven Phelps    MR#:  [id number]  DATE OF BIRTH:  [birth year]  DATE OF ADMISSION:  05/12/2018 ADMITTING PHYSICIAN: Loletha Grayer, MD  DATE OF DISCHARGE: No discharge date for patient encounter.  PRIMARY CARE PHYSICIAN: Juluis Pitch, MD   ADMISSION DIAGNOSIS:  Acute kidney injury  Hyponatremia Uncontrolled diabetes mellitus Hypertension Hyperlipidemia   DISCHARGE DIAGNOSIS:  Acute kidney injury   hyponatremia Hyperkalemia Type 2 diabetes mellitus uncontrolled Hypertension   SECONDARY DIAGNOSIS:   Past Medical History:  Diagnosis Date  . Arthritis   . Atrial fibrillation ([place])   . Cancer ([place])    skin  . Diabetes mellitus without complication ([place])   . Glaucoma (increased eye pressure)   . Heart murmur   . HLD (hyperlipidemia)   . Hypertension   . Shortness of breath dyspnea      ADMITTING HISTORY Steven Phelps  is a 84 y.o. male with a known history of diabetes hypertension hyperlipidemia presents after a fall.  The patient lives in assisted living and normally walks with a cane.  He has recently started back on blood pressure medications.  Patient states that after the fall he had some rib pain and knee pain but currently not having pain.  In the ER he was found to have a an acute kidney injury with a creatinine of 2.42 and a potassium of 6.1 and a sodium of 129.  Hospitalist services were contacted for further evaluation.  HOSPITAL COURSE:  Patient admitted to the medical floor.  Her diuretics that his Lasix were stopped.  In view of hyperkalemia losartan was also stopped.  Patient was hydrated with IV fluids and renal function was monitored.  Her hyponatremia improved with IV fluid hydration and hyperkalemia resolved.  In the emergency room at the time of admission patient was given IV calcium IV insulin IV sodium bicarbonate.  Renal failure also improved with IV fluid hydration.  Patient's  diabetes mellitus was also uncontrolled and it was controlled with increased dose of Lantus insulin in the hospital.  Patient was evaluated by diabetes management coordinator in the hospital.  At the time of discharge patient's sodium and potassium are normal and creatinine is come back to baseline.  Patient will be discharged with increased dose of Lantus insulin 36 units daily along with oral glipizide.  Patient will be discharged back to the assisted living facility.  CONSULTS OBTAINED:    DRUG ALLERGIES:   Allergies  Allergen Reactions  . Synvisc [Hylan G-F 20] Other (See Comments)    "severe pain in knee"  . Zocor [Simvastatin] Other (See Comments)    "muscle aches in joints"    DISCHARGE MEDICATIONS:   Allergies as of 05/14/2018      Reactions   Synvisc [hylan G-f 20] Other (See Comments)   "severe pain in knee"   Zocor [simvastatin] Other (See Comments)   "muscle aches in joints"      Medication List    TAKE these medications   amLODipine 5 MG tablet Commonly known as:  NORVASC Take 5 mg by mouth daily.   aspirin 81 MG tablet Take 1 tablet (81 mg total) by mouth daily.   atorvastatin 40 MG tablet Commonly known as:  LIPITOR Take 40 mg by mouth daily at 6 PM.   furosemide 20 MG tablet Commonly known as:  LASIX Take 1 tablet (20 mg total) by mouth daily. Start taking on:  05/15/2018 What changed:  when to take this   glipiZIDE 10 MG tablet Commonly known as:  GLUCOTROL Take 10 mg by mouth 2 (two) times daily before a meal.   insulin glargine 100 unit/mL Sopn Commonly known as:  LANTUS Inject 0.36 mLs (36 Units total) into the skin daily. What changed:  how much to take   latanoprost 0.005 % ophthalmic solution Commonly known as:  XALATAN Place 1 drop into both eyes at bedtime.   losartan 50 MG tablet Commonly known as:  COZAAR Take 1 tablet (50 mg total) by mouth daily. Start taking on:  05/15/2018 What changed:  when to take this   metoprolol  tartrate 100 MG tablet Commonly known as:  LOPRESSOR Take 100 mg by mouth 2 (two) times daily.       Today  Patient seen and evaluated on the day of discharge No complaints of any chest pain, shortness of breath No fever and chills  VITAL SIGNS:  Blood pressure (!) 115/59, pulse 74, temperature (!) 97.5 F (36.4 C), temperature source Oral, resp. rate 20, height 5\' 8"  (1.727 m), weight 95.3 kg (210 lb), SpO2 100 %.  I/O:    Intake/Output Summary (Last 24 hours) at 05/14/2018 1105 Last data filed at 05/14/2018 0929 Gross per 24 hour  Intake 2240 ml  Output 300 ml  Net 1940 ml    PHYSICAL EXAMINATION:  Physical Exam  GENERAL:  84 y.o.-year-old patient lying in the bed with no acute distress.  LUNGS: Normal breath sounds bilaterally, no wheezing, rales,rhonchi or crepitation. No use of accessory muscles of respiration.  CARDIOVASCULAR: S1, S2 normal. No murmurs, rubs, or gallops.  ABDOMEN: Soft, non-tender, non-distended. Bowel sounds present. No organomegaly or mass.  NEUROLOGIC: Moves all 4 extremities. PSYCHIATRIC: The patient is alert and oriented x 3.  SKIN: No obvious rash, lesion, or ulcer.   DATA REVIEW:   CBC Recent Labs  Lab 05/12/18 0944  WBC 7.8  HGB 12.3*  HCT 36.7*  PLT 219    Chemistries  Recent Labs  Lab 05/12/18 0944  05/14/18 0442  NA 129*   < > 136  K 6.1*   < > 4.8  CL 100*   < > 106  CO2 19*   < > 24  GLUCOSE 468*   < > 137*  BUN 69*   < > 35*  CREATININE 2.42*   < > 1.04  CALCIUM 8.6*   < > 9.1  AST 15  --   --   ALT 13*  --   --   ALKPHOS 112  --   --   BILITOT 0.7  --   --    < > = values in this interval not displayed.    Cardiac Enzymes Recent Labs  Lab 05/12/18 [place] <0.03    Microbiology Results  Results for orders placed or performed during the hospital encounter of 11/22/15  Culture, blood (routine x 2)     Status: None   Collection Time: 11/22/15 12:47 PM  Result Value Ref Range Status   Specimen  Description BLOOD LEFT ARM  Final   Special Requests BOTTLES DRAWN AEROBIC AND ANAEROBIC [place]  Final   Culture NO GROWTH 5 DAYS  Final   Report Status 11/27/2015 FINAL  Final  Culture, blood (routine x 2)     Status: None   Collection Time: 11/22/15  1:40 PM  Result Value Ref Range Status   Specimen Description BLOOD RIGHT HAND  Final   Special Requests  BOTTLES DRAWN AEROBIC AND ANAEROBIC  1CC  Final   Culture NO GROWTH 5 DAYS  Final   Report Status 11/27/2015 FINAL  Final    RADIOLOGY:  Ct Head Wo Contrast  Result Date: 05/12/2018 CLINICAL DATA:  Unwitnessed fall with confusion EXAM: CT HEAD WITHOUT CONTRAST TECHNIQUE: Contiguous axial images were obtained from the base of the skull through the vertex without intravenous contrast. COMPARISON:  None. FINDINGS: Brain: There is moderate diffuse atrophy. There is no intracranial mass, hemorrhage, extra-axial fluid collection, or midline shift. There is patchy small vessel disease in the centra semiovale bilaterally. No acute infarct is evident. Vascular: No vascular calcifications are appreciable. There is calcification in each distal vertebral artery as well as in each carotid siphon region. Skull: There is a defect in the anterior left temporal bone, likely of postoperative etiology. Bony calvarium elsewhere intact. Sinuses/Orbits: There is mild mucosal thickening in the right maxillary antrum. There is mucosal thickening in several ethmoid air cells bilaterally. Other visualized paranasal sinuses are clear. Orbits appear symmetric bilaterally. Other: Mastoids on the left are clear. There is opacification in multiple mastoid air cells on the right. IMPRESSION: Atrophy with patchy small vessel disease in the periventricular white matter. No acute infarct. No mass or hemorrhage. There are foci of arterial vascular calcification. Question postoperative defect anterior left temporal bone. Bony calvarium otherwise appears unremarkable. Right-sided mastoid  air cell disease. Areas of paranasal sinus disease as well. Electronically Signed   By: Lowella Grip III M.D.   On: 05/12/2018 11:56   Ct Pelvis Wo Contrast  Result Date: 05/12/2018 CLINICAL DATA:  Fall today. Severe left hip pain. Insufficient hip radiographs. Initial encounter. EXAM: CT PELVIS WITHOUT CONTRAST TECHNIQUE: Multidetector CT imaging of the pelvis was performed following the standard protocol without intravenous contrast. COMPARISON:  None. FINDINGS: Urinary Tract: Unremarkable urinary bladder. Bowel: Mild diverticulosis of visualized portion of descending and sigmoid colon. No evidence of diverticulitis in these regions. Vascular/Lymphatic: No pathologically enlarged lymph nodes or other significant abnormality. Reproductive:  No mass or other significant abnormality. Other: No evidence of pelvic hematoma or hemoperitoneum. Musculoskeletal: No evidence of acute hip or pelvic fracture. Old fracture deformity of the left pubis noted. IMPRESSION: No evidence of hip or pelvic fracture, or other acute findings. Electronically Signed   By: Earle Gell M.D.   On: 05/12/2018 11:57    Follow up with PCP in 1 week.  Management plans discussed with the patient, family and they are in agreement.  CODE STATUS:     Code Status Orders  (From admission, onward)        Start     Ordered   05/12/18 1321  Full code  Continuous     05/12/18 1320    Code Status History    Date Active Date Inactive Code Status Order ID Comments User Context   11/22/2015 1831 11/24/2015 1908 Full Code [id number]  Lance Coon, MD Inpatient   06/03/2015 1241 06/06/2015 1357 Full Code [id number]  Dereck Leep, MD Inpatient      TOTAL TIME TAKING CARE OF THIS PATIENT ON DAY OF DISCHARGE: more than 35 minutes.   Saundra Shelling M.D on 05/14/2018 at 11:05 AM  Between 7am to 6pm - Pager - [phone]  After 6pm go to [email or web address] - password [email or web address] [place] [facility]  Office   [phone]  CC: Primary care physician; Juluis Pitch, MD  Note: This dictation was prepared with Dragon dictation along with smaller phrase technology. Any transcriptional  errors that result from this process are unintentional.

## 2018-05-14 NOTE — [place] Notes (Signed)
Report was called to [facility] at [facility].Marland Kitchen  Discharge instructions reviewed with the patient and his son.  Patient sent out via wheelchair to his sons car

## 2019-06-08 ENCOUNTER — Emergency Department: Payer: Medicare Other

## 2019-06-08 ENCOUNTER — Inpatient Hospital Stay
Admission: EM | Admit: 2019-06-08 | Discharge: 2019-06-12 | DRG: 683 | Disposition: A | Discharge status: Home / Self Care | Payer: Medicare Other | Attending: Internal Medicine | Admitting: Internal Medicine

## 2019-06-08 ENCOUNTER — Encounter: Payer: Self-pay | Admitting: Emergency Medicine

## 2019-06-08 ENCOUNTER — Other Ambulatory Visit: Payer: Self-pay

## 2019-06-08 DIAGNOSIS — W1830XA Fall on same level, unspecified, initial encounter: Secondary | ICD-10-CM | POA: Diagnosis present

## 2019-06-08 DIAGNOSIS — T148XXA Other injury of unspecified body region, initial encounter: Secondary | ICD-10-CM

## 2019-06-08 DIAGNOSIS — Z79890 Hormone replacement therapy (postmenopausal): Secondary | ICD-10-CM

## 2019-06-08 DIAGNOSIS — E785 Hyperlipidemia, unspecified: Secondary | ICD-10-CM | POA: Diagnosis present

## 2019-06-08 DIAGNOSIS — S01511A Laceration without foreign body of lip, initial encounter: Secondary | ICD-10-CM | POA: Diagnosis present

## 2019-06-08 DIAGNOSIS — E11649 Type 2 diabetes mellitus with hypoglycemia without coma: Secondary | ICD-10-CM | POA: Diagnosis not present

## 2019-06-08 DIAGNOSIS — I951 Orthostatic hypotension: Secondary | ICD-10-CM | POA: Diagnosis present

## 2019-06-08 DIAGNOSIS — Y92129 Unspecified place in nursing home as the place of occurrence of the external cause: Secondary | ICD-10-CM

## 2019-06-08 DIAGNOSIS — Z20828 Contact with and (suspected) exposure to other viral communicable diseases: Secondary | ICD-10-CM | POA: Diagnosis present

## 2019-06-08 DIAGNOSIS — Z7982 Long term (current) use of aspirin: Secondary | ICD-10-CM

## 2019-06-08 DIAGNOSIS — I480 Paroxysmal atrial fibrillation: Secondary | ICD-10-CM | POA: Diagnosis present

## 2019-06-08 DIAGNOSIS — N179 Acute kidney failure, unspecified: Secondary | ICD-10-CM | POA: Diagnosis present

## 2019-06-08 DIAGNOSIS — E860 Dehydration: Secondary | ICD-10-CM | POA: Diagnosis present

## 2019-06-08 DIAGNOSIS — N390 Urinary tract infection, site not specified: Secondary | ICD-10-CM | POA: Diagnosis present

## 2019-06-08 DIAGNOSIS — D631 Anemia in chronic kidney disease: Secondary | ICD-10-CM | POA: Diagnosis present

## 2019-06-08 DIAGNOSIS — H409 Unspecified glaucoma: Secondary | ICD-10-CM | POA: Diagnosis present

## 2019-06-08 DIAGNOSIS — E039 Hypothyroidism, unspecified: Secondary | ICD-10-CM | POA: Diagnosis present

## 2019-06-08 DIAGNOSIS — H9190 Unspecified hearing loss, unspecified ear: Secondary | ICD-10-CM | POA: Diagnosis present

## 2019-06-08 DIAGNOSIS — I4581 Long QT syndrome: Secondary | ICD-10-CM | POA: Diagnosis present

## 2019-06-08 DIAGNOSIS — I129 Hypertensive chronic kidney disease with stage 1 through stage 4 chronic kidney disease, or unspecified chronic kidney disease: Secondary | ICD-10-CM | POA: Diagnosis present

## 2019-06-08 DIAGNOSIS — S51811A Laceration without foreign body of right forearm, initial encounter: Secondary | ICD-10-CM | POA: Diagnosis present

## 2019-06-08 DIAGNOSIS — I482 Chronic atrial fibrillation, unspecified: Secondary | ICD-10-CM | POA: Diagnosis present

## 2019-06-08 DIAGNOSIS — Z8249 Family history of ischemic heart disease and other diseases of the circulatory system: Secondary | ICD-10-CM

## 2019-06-08 DIAGNOSIS — E1122 Type 2 diabetes mellitus with diabetic chronic kidney disease: Secondary | ICD-10-CM | POA: Diagnosis present

## 2019-06-08 DIAGNOSIS — Z794 Long term (current) use of insulin: Secondary | ICD-10-CM

## 2019-06-08 DIAGNOSIS — Z66 Do not resuscitate: Secondary | ICD-10-CM | POA: Diagnosis present

## 2019-06-08 DIAGNOSIS — Z87891 Personal history of nicotine dependence: Secondary | ICD-10-CM | POA: Diagnosis not present

## 2019-06-08 DIAGNOSIS — R296 Repeated falls: Secondary | ICD-10-CM | POA: Diagnosis present

## 2019-06-08 DIAGNOSIS — Z833 Family history of diabetes mellitus: Secondary | ICD-10-CM

## 2019-06-08 DIAGNOSIS — S51812A Laceration without foreign body of left forearm, initial encounter: Secondary | ICD-10-CM | POA: Diagnosis present

## 2019-06-08 DIAGNOSIS — W19XXXA Unspecified fall, initial encounter: Secondary | ICD-10-CM

## 2019-06-08 DIAGNOSIS — Z79899 Other long term (current) drug therapy: Secondary | ICD-10-CM

## 2019-06-08 DIAGNOSIS — N183 Chronic kidney disease, stage 3 (moderate): Secondary | ICD-10-CM | POA: Diagnosis present

## 2019-06-08 DIAGNOSIS — Z952 Presence of prosthetic heart valve: Secondary | ICD-10-CM

## 2019-06-08 LAB — HEPATIC FUNCTION PANEL
ALT: 21 U/L (ref 0–44)
AST: 30 U/L (ref 15–41)
Albumin: 3.2 g/dL — ABNORMAL LOW (ref 3.5–5.0)
Alkaline Phosphatase: 36 U/L — ABNORMAL LOW (ref 38–126)
Bilirubin, Direct: 0.1 mg/dL (ref 0.0–0.2)
Indirect Bilirubin: 0.4 mg/dL (ref 0.3–0.9)
Total Bilirubin: 0.5 mg/dL (ref 0.3–1.2)
Total Protein: 6 g/dL — ABNORMAL LOW (ref 6.5–8.1)

## 2019-06-08 LAB — BASIC METABOLIC PANEL
Anion gap: 9 (ref 5–15)
BUN: 61 mg/dL — ABNORMAL HIGH (ref 8–23)
CO2: 23 mmol/L (ref 22–32)
Calcium: 8.6 mg/dL — ABNORMAL LOW (ref 8.9–10.3)
Chloride: 107 mmol/L (ref 98–111)
Creatinine, Ser: 3.59 mg/dL — ABNORMAL HIGH (ref 0.61–1.24)
GFR calc Af Amer: 17 mL/min — ABNORMAL LOW (ref >60)
GFR calc non Af Amer: 15 mL/min — ABNORMAL LOW (ref >60)
Glucose, Bld: 119 mg/dL — ABNORMAL HIGH (ref 70–99)
Potassium: 4.7 mmol/L (ref 3.5–5.1)
Sodium: 139 mmol/L (ref 135–145)

## 2019-06-08 LAB — CBC WITH DIFFERENTIAL/PLATELET
Abs Immature Granulocytes: 0.07 10*3/uL (ref 0.00–0.07)
Basophils Absolute: 0.1 10*3/uL (ref 0.0–0.1)
Basophils Relative: 1 %
Eosinophils Absolute: 0.1 10*3/uL (ref 0.0–0.5)
Eosinophils Relative: 1 %
HCT: 34.2 % — ABNORMAL LOW (ref 39.0–52.0)
Hemoglobin: 10.7 g/dL — ABNORMAL LOW (ref 13.0–17.0)
Immature Granulocytes: 1 %
Lymphocytes Relative: 21 %
Lymphs Abs: 1.4 10*3/uL (ref 0.7–4.0)
MCH: 29.3 pg (ref 26.0–34.0)
MCHC: 31.3 g/dL (ref 30.0–36.0)
MCV: 93.7 fL (ref 80.0–100.0)
Monocytes Absolute: 0.6 10*3/uL (ref 0.1–1.0)
Monocytes Relative: 8 %
Neutro Abs: 4.5 10*3/uL (ref 1.7–7.7)
Neutrophils Relative %: 68 %
Platelets: 251 10*3/uL (ref 150–400)
RBC: 3.65 MIL/uL — ABNORMAL LOW (ref 4.22–5.81)
RDW: 14.5 % (ref 11.5–15.5)
WBC: 6.6 10*3/uL (ref 4.0–10.5)
nRBC: 0 % (ref 0.0–0.2)

## 2019-06-08 LAB — LIPASE, BLOOD: Lipase: 28 U/L (ref 11–51)

## 2019-06-08 LAB — GLUCOSE, CAPILLARY
Glucose-Capillary: 81 mg/dL (ref 70–99)
Glucose-Capillary: 111 mg/dL — ABNORMAL HIGH (ref 70–99)

## 2019-06-08 LAB — SARS CORONAVIRUS 2 BY RT PCR (HOSPITAL ORDER, PERFORMED IN ~~LOC~~ HOSPITAL LAB): SARS Coronavirus 2: NEGATIVE

## 2019-06-08 LAB — MAGNESIUM: Magnesium: 2.4 mg/dL (ref 1.7–2.4)

## 2019-06-08 MED ORDER — SODIUM CHLORIDE 0.9 % IV SOLN
INTRAVENOUS | Status: DC
  Administered 2019-06-08 – 2019-06-11 (×8): via INTRAVENOUS

## 2019-06-08 MED ORDER — ATORVASTATIN CALCIUM 20 MG PO TABS
40.0000 mg | ORAL_TABLET | Freq: Every day | ORAL | Status: DC
  Administered 2019-06-08 – 2019-06-11 (×4): 40 mg via ORAL
  Filled 2019-06-08 (×4): qty 2

## 2019-06-08 MED ORDER — FENOFIBRATE 54 MG PO TABS
54.0000 mg | ORAL_TABLET | Freq: Every day | ORAL | Status: DC
  Administered 2019-06-08 – 2019-06-11 (×4): 54 mg via ORAL
  Filled 2019-06-08 (×5): qty 1

## 2019-06-08 MED ORDER — ASPIRIN EC 81 MG PO TBEC
81.0000 mg | DELAYED_RELEASE_TABLET | Freq: Every day | ORAL | Status: DC
  Administered 2019-06-09 – 2019-06-12 (×4): 81 mg via ORAL
  Filled 2019-06-08 (×4): qty 1

## 2019-06-08 MED ORDER — AMLODIPINE BESYLATE 5 MG PO TABS: 5.0000 mg | ORAL_TABLET | Freq: Every day | ORAL | Status: DC

## 2019-06-08 MED ORDER — METOPROLOL TARTRATE 50 MG PO TABS
100.0000 mg | ORAL_TABLET | Freq: Two times a day (BID) | ORAL | Status: DC
  Administered 2019-06-08 – 2019-06-11 (×4): 100 mg via ORAL
  Filled 2019-06-08 (×6): qty 2

## 2019-06-08 MED ORDER — HEPARIN SODIUM (PORCINE) 5000 UNIT/ML IJ SOLN
5000.0000 [IU] | Freq: Three times a day (TID) | INTRAMUSCULAR | Status: DC
  Administered 2019-06-08 – 2019-06-12 (×11): 5000 [IU] via SUBCUTANEOUS
  Filled 2019-06-08 (×11): qty 1

## 2019-06-08 MED ORDER — INSULIN ASPART 100 UNIT/ML ~~LOC~~ SOLN
0.0000 [IU] | Freq: Three times a day (TID) | SUBCUTANEOUS | Status: DC
  Administered 2019-06-11: 1 [IU] via SUBCUTANEOUS
  Administered 2019-06-12 (×2): 2 [IU] via SUBCUTANEOUS
  Filled 2019-06-08 (×3): qty 1

## 2019-06-08 MED ORDER — INSULIN ASPART 100 UNIT/ML ~~LOC~~ SOLN: 0.0000 [IU] | Freq: Every day | SUBCUTANEOUS | Status: DC

## 2019-06-08 MED ORDER — LEVOTHYROXINE SODIUM 50 MCG PO TABS
25.0000 ug | ORAL_TABLET | Freq: Every day | ORAL | Status: DC
  Administered 2019-06-09 – 2019-06-12 (×4): 25 ug via ORAL
  Filled 2019-06-08 (×4): qty 1

## 2019-06-08 MED ORDER — INSULIN GLARGINE 100 UNIT/ML ~~LOC~~ SOLN
36.0000 [IU] | Freq: Every day | SUBCUTANEOUS | Status: DC
  Administered 2019-06-09 – 2019-06-10 (×2): 36 [IU] via SUBCUTANEOUS
  Filled 2019-06-08 (×2): qty 0.36

## 2019-06-08 MED ORDER — SODIUM CHLORIDE 0.9 % IV BOLUS
500.0000 mL | Freq: Once | INTRAVENOUS | Status: AC
  Administered 2019-06-08: 500 mL via INTRAVENOUS

## 2019-06-08 MED ORDER — CLOTRIMAZOLE 1 % EX CREA
1.0000 "application " | TOPICAL_CREAM | Freq: Two times a day (BID) | CUTANEOUS | Status: DC
  Administered 2019-06-08 – 2019-06-09 (×3): 1 via TOPICAL
  Filled 2019-06-08 (×2): qty 15

## 2019-06-08 MED ORDER — LATANOPROST 0.005 % OP SOLN
1.0000 [drp] | Freq: Every day | OPHTHALMIC | Status: DC
  Administered 2019-06-08 – 2019-06-11 (×4): 1 [drp] via OPHTHALMIC
  Filled 2019-06-08: qty 2.5

## 2019-06-08 NOTE — ED Notes (Signed)
ED TO INPATIENT HANDOFF REPORT  ED Nurse Name and Phone #: Anderson Malta [phone]  S Name/Age/Gender Steven Phelps 85 y.o. male Room/Bed: ED12A/ED12A  Code Status   Code Status: DNR  Home/SNF/Other Skilled nursing facility Patient oriented to: self, place, time and situation Is this baseline? Yes   Triage Complete: Triage complete  Chief Complaint Hypotension  Triage Note Pt to ED via [facility] from [facility] main healthcare building for unwitnessed fall and hypotension. EMS reports that they were called out for fall, pt found to have skin tears on both arms and a small laceration to the lip. Pt reported to EMS that he was dizzy when looking up. EMS was unable to get an automatic BP, Pts manual BP was 64 Systolic. Pt was given fluid bolus by EMS. Pt has audible wheezing on assessment. Pt denies chest pain or cough. Pt is in NAd.    Allergies Allergies  Allergen Reactions  . Synvisc [Hylan G-F 20] Other (See Comments)    "severe pain in knee"  . Zocor [Simvastatin] Other (See Comments)    "muscle aches in joints"    Level of Care/Admitting Diagnosis ED Disposition    ED Disposition Condition [place]: [place] [[id number]]  Level of Care: Med-Surg [16]  Covid Evaluation: Confirmed COVID Negative  Diagnosis: AKI (acute kidney injury) [facility]) [[id number]]  Admitting Physician: Otila Back [[id number]]  Attending Physician: Otila Back [[id number]]  Estimated length of stay: 3 - 4 days  Certification:: I certify this patient will need inpatient services for at least 2 midnights  PT Class (Do Not Modify): Inpatient [101]  PT Acc Code (Do Not Modify): Private [1]       B Medical/Surgery History Past Medical History:  Diagnosis Date  . Arthritis   . Atrial fibrillation ([place])   . Cancer ([place])    skin  . Diabetes mellitus without complication ([place])   . Glaucoma (increased eye pressure)   . Heart murmur   . HLD (hyperlipidemia)   . Hypertension    . Shortness of breath dyspnea    Past Surgical History:  Procedure Laterality Date  . AORTIC VALVE REPLACEMENT  2010  . CARDIAC CATHETERIZATION    . CHOLECYSTECTOMY    . EYE SURGERY Bilateral    cataract extraction  . TONSILLECTOMY    . TOTAL KNEE ARTHROPLASTY Left 06/03/2015   Procedure: TOTAL KNEE ARTHROPLASTY;  Surgeon: Dereck Leep, MD;  Location: [facility] ORS;  Service: Orthopedics;  Laterality: Left;     A IV Location/Drains/Wounds Patient Lines/Drains/Airways Status   Active Line/Drains/Airways    Name:   Placement date:   Placement time:   Site:   Days:   Peripheral IV 06/08/19 Right Forearm   06/08/19    1103    Forearm   less than 1   Incision (Closed) 06/03/15 Knee Left   06/03/15    0832     1466          Intake/Output Last 24 hours No intake or output data in the 24 hours ending 06/08/19 1453  Labs/Imaging Results for orders placed or performed during the hospital encounter of 06/08/19 (from the past 48 hour(s))  CBC with Differential     Status: Abnormal   Collection Time: 06/08/19 11:02 AM  Result Value Ref Range   WBC 6.6 4.0 - 10.5 K/uL   RBC 3.65 (L) 4.22 - 5.81 MIL/uL   Hemoglobin 10.7 (L) 13.0 - 17.0 g/dL   HCT  34.2 (L) 39.0 - 52.0 %   MCV 93.7 80.0 - 100.0 fL   MCH 29.3 26.0 - 34.0 pg   MCHC 31.3 30.0 - 36.0 g/dL   RDW 14.5 11.5 - 15.5 %   Platelets 251 150 - 400 K/uL   nRBC 0.0 0.0 - 0.2 %   Neutrophils Relative % 68 %   Neutro Abs 4.5 1.7 - 7.7 K/uL   Lymphocytes Relative 21 %   Lymphs Abs 1.4 0.7 - 4.0 K/uL   Monocytes Relative 8 %   Monocytes Absolute 0.6 0.1 - 1.0 K/uL   Eosinophils Relative 1 %   Eosinophils Absolute 0.1 0.0 - 0.5 K/uL   Basophils Relative 1 %   Basophils Absolute 0.1 0.0 - 0.1 K/uL   Immature Granulocytes 1 %   Abs Immature Granulocytes 0.07 0.00 - 0.07 K/uL    Comment: Performed at [facility], [address] [place], [place] [address]  Basic metabolic panel     Status: Abnormal   Collection Time:  06/08/19 11:02 AM  Result Value Ref Range   Sodium 139 135 - 145 mmol/L   Potassium 4.7 3.5 - 5.1 mmol/L   Chloride 107 98 - 111 mmol/L   CO2 23 22 - 32 mmol/L   Glucose, Bld 119 (H) 70 - 99 mg/dL   BUN 61 (H) 8 - 23 mg/dL   Creatinine, Ser 3.59 (H) 0.61 - 1.24 mg/dL   Calcium 8.6 (L) 8.9 - 10.3 mg/dL   GFR calc non Af Amer 15 (L) >60 mL/min   GFR calc Af Amer 17 (L) >60 mL/min   Anion gap 9 5 - 15    Comment: Performed at [facility], [address], [place], [place] [address]  Magnesium     Status: None   Collection Time: 06/08/19 11:02 AM  Result Value Ref Range   Magnesium 2.4 1.7 - 2.4 mg/dL    Comment: Performed at [facility], [address] [place], [place] [address]  Hepatic function panel     Status: Abnormal   Collection Time: 06/08/19 11:15 AM  Result Value Ref Range   Total Protein 6.0 (L) 6.5 - 8.1 g/dL   Albumin 3.2 (L) 3.5 - 5.0 g/dL   AST 30 15 - 41 U/L   ALT 21 0 - 44 U/L   Alkaline Phosphatase 36 (L) 38 - 126 U/L   Total Bilirubin 0.5 0.3 - 1.2 mg/dL   Bilirubin, Direct 0.1 0.0 - 0.2 mg/dL   Indirect Bilirubin 0.4 0.3 - 0.9 mg/dL    Comment: Performed at [facility], [address] [place], [place] [address]  Lipase, blood     Status: None   Collection Time: 06/08/19 11:15 AM  Result Value Ref Range   Lipase 28 11 - 51 U/L    Comment: Performed at [facility], [address], [place], [place] [address]  SARS Coronavirus 2 (CEPHEID - Performed in [facility] hospital lab), Hosp Order     Status: None   Collection Time: 06/08/19 12:03 PM   Specimen: Nasopharyngeal Swab  Result Value Ref Range   SARS Coronavirus 2 NEGATIVE NEGATIVE    Comment: (NOTE) If result is NEGATIVE SARS-CoV-2 target nucleic acids are NOT DETECTED. The SARS-CoV-2 RNA is generally detectable in upper and lower  respiratory specimens during the acute phase of infection. The lowest  concentration of SARS-CoV-2 viral copies this assay can  detect is 250  copies / mL. A negative result  does not preclude SARS-CoV-2 infection  and should not be used as the sole basis for treatment or other  patient management decisions.  A negative result may occur with  improper specimen collection / handling, submission of specimen other  than nasopharyngeal swab, presence of viral mutation(s) within the  areas targeted by this assay, and inadequate number of viral copies  (<250 copies / mL). A negative result must be combined with clinical  observations, patient history, and epidemiological information. If result is POSITIVE SARS-CoV-2 target nucleic acids are DETECTED. The SARS-CoV-2 RNA is generally detectable in upper and lower  respiratory specimens dur ing the acute phase of infection.  Positive  results are indicative of active infection with SARS-CoV-2.  Clinical  correlation with patient history and other diagnostic information is  necessary to determine patient infection status.  Positive results do  not rule out bacterial infection or co-infection with other viruses. If result is PRESUMPTIVE POSTIVE SARS-CoV-2 nucleic acids MAY BE PRESENT.   A presumptive positive result was obtained on the submitted specimen  and confirmed on repeat testing.  While 2019 novel coronavirus  (SARS-CoV-2) nucleic acids may be present in the submitted sample  additional confirmatory testing may be necessary for epidemiological  and / or clinical management purposes  to differentiate between  SARS-CoV-2 and other Sarbecovirus currently known to infect humans.  If clinically indicated additional testing with an alternate test  methodology [phone]) is advised. The SARS-CoV-2 RNA is generally  detectable in upper and lower respiratory sp ecimens during the acute  phase of infection. The expected result is Negative. Fact Sheet for Patients:  StrictlyIdeas.no Fact Sheet for Healthcare  Providers: BankingDealers.co.za This test is not yet approved or cleared by the [place] FDA and has been authorized for detection and/or diagnosis of SARS-CoV-2 by FDA under an Emergency Use Authorization (EUA).  This EUA will remain in effect (meaning this test can be used) for the duration of the COVID-19 declaration under Section 564(b)(1) of the Act, 21 U.S.C. section 360bbb-3(b)(1), unless the authorization is terminated or revoked sooner. Performed at [facility], [place], [place] [address]    Ct Head Wo Contrast  Result Date: 06/08/2019 CLINICAL DATA:  Patient status post fall. EXAM: CT HEAD WITHOUT CONTRAST TECHNIQUE: Contiguous axial images were obtained from the base of the skull through the vertex without intravenous contrast. COMPARISON:  Brain CT 05/12/2018 FINDINGS: Brain: Ventricles and sulci are prominent compatible with atrophy. Periventricular and subcortical white matter hypodensities compatible with chronic microvascular ischemic changes. No evidence for acute cortically based infarct, intracranial hemorrhage, mass lesion or mass-effect. Vascular: Unremarkable Skull: Intact. Sinuses/Orbits: Paranasal sinuses are well aerated. Opacification of the right mastoid air cells. Left mastoid air cells are unremarkable. Other: None. IMPRESSION: No acute intracranial process. Atrophy and chronic microvascular ischemic changes. Opacification right mastoid air cells. Electronically Signed   By: Lovey Newcomer M.D.   On: 06/08/2019 12:49   US Renal  Result Date: 06/08/2019 CLINICAL DATA:  Patient with elevated creatinine. EXAM: RENAL / URINARY TRACT ULTRASOUND COMPLETE COMPARISON:  None. FINDINGS: Right Kidney: Renal measurements: 10.4 x 5.2 x 6.4 cm = volume: 184 mL. Mild renal cortical thinning. Normal renal cortical echogenicity. No hydronephrosis. Left Kidney: Renal measurements: 11.5 x 5.0 x 7.5 cm = volume: 225 mL. Mild renal cortical  thinning. Normal renal cortical echogenicity. No hydronephrosis. Bladder: Appears normal for degree of bladder distention. Prevoid: 226 cc IMPRESSION: No hydronephrosis. Mild renal cortical thinning. Electronically Signed  By: Lovey Newcomer M.D.   On: 06/08/2019 13:48    Pending Labs Unresulted Labs (From admission, onward)    Start     Ordered   06/09/19 8101  Basic metabolic panel  Tomorrow morning,   STAT     06/08/19 1447   06/09/19 0500  CBC  Tomorrow morning,   STAT     06/08/19 1447   06/09/19 0500  Magnesium  Tomorrow morning,   STAT     06/08/19 1447   06/09/19 0500  Phosphorus  Tomorrow morning,   STAT     06/08/19 1447   06/09/19 0500  Hemoglobin A1c  Tomorrow morning,   STAT    Comments: To assess prior glycemic control    06/08/19 1447   06/09/19 7510  Basic metabolic panel  Tomorrow morning,   STAT     06/08/19 1447   06/09/19 0500  CBC  Tomorrow morning,   STAT     06/08/19 1447   06/09/19 0500  Magnesium  Tomorrow morning,   STAT     06/08/19 1447   06/09/19 0500  Phosphorus  Tomorrow morning,   STAT     06/08/19 1447   06/09/19 0500  TSH  Tomorrow morning,   STAT     06/08/19 1447   06/08/19 1203  Urinalysis, Complete w Microscopic  Once,   STAT     06/08/19 1202          Vitals/Pain Today's Vitals   06/08/19 1200 06/08/19 1345 06/08/19 1400 06/08/19 1430  BP: (!) 107/54 (!) 94/58 (!) 109/59 (!) 98/52  Pulse: 61 62 69 61  Resp: 17 16 15 17   Temp:      TempSrc:      SpO2: 96% 95% 96% 96%  PainSc:        Isolation Precautions No active isolations  Medications Medications  aspirin tablet 81 mg (has no administration in time range)  amLODipine (NORVASC) tablet 5 mg (has no administration in time range)  atorvastatin (LIPITOR) tablet 40 mg (has no administration in time range)  fenofibrate tablet 54 mg (has no administration in time range)  metoprolol tartrate (LOPRESSOR) tablet 100 mg (has no administration in time range)  insulin glargine (LANTUS)  Solostar Pen 36 Units (has no administration in time range)  levothyroxine (SYNTHROID) tablet 25 mcg (has no administration in time range)  clotrimazole (LOTRIMIN) 1 % cream 1 application (has no administration in time range)  latanoprost (XALATAN) 0.005 % ophthalmic solution 1 drop (has no administration in time range)  heparin injection 5,000 Units (has no administration in time range)  insulin aspart (novoLOG) injection 0-9 Units (has no administration in time range)  insulin aspart (novoLOG) injection 0-5 Units (has no administration in time range)  0.9 %  sodium chloride infusion (has no administration in time range)  sodium chloride 0.9 % bolus 500 mL (0 mLs Intravenous Stopped 06/08/19 1336)    Mobility walks High fall risk   Focused Assessments Pulmonary Assessment Handoff:  Lung sounds: L Breath Sounds: Expiratory wheezes R Breath Sounds: Expiratory wheezes          R Recommendations: See Admitting Provider Note  Report given to:   Additional Notes: Per son, pt has sundowners and gets very confused at night.

## 2019-06-08 NOTE — H&P (Addendum)
[place] [facility] [place] NAME: Steven Phelps    MR#:  [id number]  DATE OF BIRTH:  1931/10/18  DATE OF ADMISSION:  06/08/2019  PRIMARY CARE PHYSICIAN: Juluis Pitch, MD   REQUESTING/REFERRING PHYSICIAN: Delman Kitten  CHIEF COMPLAINT:   Chief Complaint  Patient presents with   Fall   Hypotension    HISTORY OF PRESENT ILLNESS:  Steven Phelps  is a 85 y.o. male with a known history of chronic atrial fibrillation, hypertension, diabetes mellitus, glaucoma, hyperlipidemia, decreased hearing, hypothyroidism and recent treatment for urinary tract infection with antibiotics with Bactrim who was brought into the emergency room today from nursing home following a fall.  No report of loss of consciousness.  Patient sustained some mild bruise with skin tear on upper extremity.  No chest pain.  No shortness of breath.  Patient was evaluated emergency room and CT scan of the head done without contrast was negative for any acute findings.  Laboratory studies done revealed evidence of acute kidney injury.  Patient appears dehydrated clinically.  Renal ultrasound done with no hydronephrosis.  Medical service called to admit patient for further evaluation and management.  PAST MEDICAL HISTORY:   Past Medical History:  Diagnosis Date   Arthritis    Atrial fibrillation ([place])    Cancer ([place])    skin   Diabetes mellitus without complication ([place])    Glaucoma (increased eye pressure)    Heart murmur    HLD (hyperlipidemia)    Hypertension    Shortness of breath dyspnea     PAST SURGICAL HISTORY:   Past Surgical History:  Procedure Laterality Date   AORTIC VALVE REPLACEMENT  2010   CARDIAC CATHETERIZATION     CHOLECYSTECTOMY     EYE SURGERY Bilateral    cataract extraction   TONSILLECTOMY     TOTAL KNEE ARTHROPLASTY Left 06/03/2015   Procedure: TOTAL KNEE ARTHROPLASTY;  Surgeon: Dereck Leep, MD;  Location: [facility] ORS;  Service:  Orthopedics;  Laterality: Left;    SOCIAL HISTORY:   Social History   Tobacco Use   Smoking status: Former Smoker    Packs/day: 0.50    Types: Cigarettes   Smokeless tobacco: Never Used  Substance Use Topics   Alcohol use: Yes    Comment: 3 drinks a day    FAMILY HISTORY:   Family History  Problem Relation Age of Onset   Diabetes Father    CAD Mother     DRUG ALLERGIES:   Allergies  Allergen Reactions   Synvisc [Hylan G-F 20] Other (See Comments)    "severe pain in knee"   Zocor [Simvastatin] Other (See Comments)    "muscle aches in joints"    REVIEW OF SYSTEMS:   Review of Systems  Constitutional: Negative for chills and fever.  HENT: Negative for tinnitus.        Significantly decreased hearing  Eyes: Negative for blurred vision and double vision.  Respiratory: Negative for cough and shortness of breath.   Cardiovascular: Negative for chest pain and palpitations.  Gastrointestinal: Negative for heartburn, nausea and vomiting.  Genitourinary: Negative for dysuria and urgency.  Musculoskeletal: Positive for falls. Negative for myalgias and neck pain.  Skin: Negative for itching.       Dry skin  Neurological: Negative for dizziness and headaches.  Psychiatric/Behavioral: Negative for depression and substance abuse.      MEDICATIONS AT HOME:   Prior to Admission medications   Medication Sig Start Date End  Date Taking? Authorizing Provider  amLODipine (NORVASC) 5 MG tablet Take 5 mg by mouth daily.    Yes [provider]  amoxicillin (AMOXIL) 500 MG capsule Take 2,000 mg by mouth. 1 hour prior to procedure   Yes [provider]  aspirin 81 MG tablet Take 1 tablet (81 mg total) by mouth daily. 06/20/15  Yes Watt Climes, PA  atorvastatin (LIPITOR) 40 MG tablet Take 40 mg by mouth daily at 6 PM.   Yes [provider]  clotrimazole (LOTRIMIN) 1 % cream Apply 1 application topically 2 (two) times daily. apply to cracks of  buttocks and armpit   Yes [provider]  fenofibrate (TRICOR) 145 MG tablet Take 145 mg by mouth daily.   Yes [provider]  furosemide (LASIX) 20 MG tablet Take 1 tablet (20 mg total) by mouth daily. 05/15/18  Yes Pyreddy, Reatha Harps, MD  insulin glargine (LANTUS) 100 unit/mL SOPN Inject 0.36 mLs (36 Units total) into the skin daily. Patient taking differently: Inject 15 Units into the skin daily.  05/14/18  Yes Pyreddy, Reatha Harps, MD  latanoprost (XALATAN) 0.005 % ophthalmic solution Place 1 drop into both eyes at bedtime.   Yes [provider]  levothyroxine (SYNTHROID) 25 MCG tablet Take 25 mcg by mouth daily before breakfast.   Yes [provider]  losartan (COZAAR) 50 MG tablet Take 1 tablet (50 mg total) by mouth daily. 05/15/18 06/08/19 Yes Pyreddy, Reatha Harps, MD  metoprolol (LOPRESSOR) 100 MG tablet Take 100 mg by mouth 2 (two) times daily.   Yes [provider]  sulfamethoxazole-trimethoprim (BACTRIM DS) 800-160 MG tablet Take 1 tablet by mouth 2 (two) times daily.   Yes [provider]      VITAL SIGNS:  Blood pressure (!) 107/54, pulse 61, temperature 98 F (36.7 C), temperature source Oral, resp. rate 17, SpO2 96 %.  PHYSICAL EXAMINATION:  Physical Exam  GENERAL:  85 y.o.-year-old patient lying in the bed with no acute distress.  EYES: Pupils equal, round, reactive to light and accommodation. No scleral icterus. Extraocular muscles intact.  HEENT: Significantly decreased hearing.  Dry oral mucosa.    NECK:  Supple, no jugular venous distention. No thyroid enlargement, no tenderness.  LUNGS: Normal breath sounds bilaterally, no wheezing, rales,rhonchi or crepitation. No use of accessory muscles of respiration.  CARDIOVASCULAR: S1, S2 normal. No murmurs, rubs, or gallops.  ABDOMEN: Soft, nontender, nondistended. Bowel sounds present. No organomegaly or mass.  EXTREMITIES: Slight laceration around the left elbow joint following fall.  No  pedal edema, cyanosis, or clubbing.  NEUROLOGIC: Cranial nerves II through XII are intact. Muscle strength 5/5 in all extremities. Sensation intact. Gait not checked.  PSYCHIATRIC: The patient is alert and oriented x 3.  SKIN: Dry skin.  No obvious rash, lesion, or ulcer.   LABORATORY PANEL:   CBC Recent Labs  Lab 06/08/19 1102  WBC 6.6  HGB 10.7*  HCT 34.2*  PLT 251   ------------------------------------------------------------------------------------------------------------------  Chemistries  Recent Labs  Lab 06/08/19 1102 06/08/19 1115  NA 139  --   K 4.7  --   CL 107  --   CO2 23  --   GLUCOSE 119*  --   BUN 61*  --   CREATININE 3.59*  --   CALCIUM 8.6*  --   MG 2.4  --   AST  --  30  ALT  --  21  ALKPHOS  --  36*  BILITOT  --  0.5   ------------------------------------------------------------------------------------------------------------------  Cardiac Enzymes No results for input(s): TROPONINI in the last 168 hours. ------------------------------------------------------------------------------------------------------------------  RADIOLOGY:  Ct Head Wo Contrast  Result Date: 06/08/2019 CLINICAL DATA:  Patient status post fall. EXAM: CT HEAD WITHOUT CONTRAST TECHNIQUE: Contiguous axial images were obtained from the base of the skull through the vertex without intravenous contrast. COMPARISON:  Brain CT 05/12/2018 FINDINGS: Brain: Ventricles and sulci are prominent compatible with atrophy. Periventricular and subcortical white matter hypodensities compatible with chronic microvascular ischemic changes. No evidence for acute cortically based infarct, intracranial hemorrhage, mass lesion or mass-effect. Vascular: Unremarkable Skull: Intact. Sinuses/Orbits: Paranasal sinuses are well aerated. Opacification of the right mastoid air cells. Left mastoid air cells are unremarkable. Other: None. IMPRESSION: No acute intracranial process. Atrophy and chronic microvascular  ischemic changes. Opacification right mastoid air cells. Electronically Signed   By: Lovey Newcomer M.D.   On: 06/08/2019 12:49   US Renal  Result Date: 06/08/2019 CLINICAL DATA:  Patient with elevated creatinine. EXAM: RENAL / URINARY TRACT ULTRASOUND COMPLETE COMPARISON:  None. FINDINGS: Right Kidney: Renal measurements: 10.4 x 5.2 x 6.4 cm = volume: 184 mL. Mild renal cortical thinning. Normal renal cortical echogenicity. No hydronephrosis. Left Kidney: Renal measurements: 11.5 x 5.0 x 7.5 cm = volume: 225 mL. Mild renal cortical thinning. Normal renal cortical echogenicity. No hydronephrosis. Bladder: Appears normal for degree of bladder distention. Prevoid: 226 cc IMPRESSION: No hydronephrosis. Mild renal cortical thinning. Electronically Signed   By: Lovey Newcomer M.D.   On: 06/08/2019 13:48      IMPRESSION AND PLAN:  Patient is an 85 year old male with history of chronic atrial fibrillation, hypertension, diabetes mellitus, glaucoma, hyperlipidemia, decreased hearing, hypothyroidism and recent treatment for urinary tract infection with antibiotics with Bactrim who was brought into the emergency room today from nursing home following fall.  Diagnosed with acute kidney injury.   1.  Acute kidney injury Most likely prerenal from dehydration.  Decreased p.o. intake.  Noted creatinine of 3.59.  Last creatinine was 1.04 on 05/2018. Renal ultrasound done with no hydronephrosis.  Placed on IV fluid hydration with normal saline. Avoiding nephrotoxic agents.  Discontinued Bactrim which patient was taken for recent UTI prior to this admission.  This can cause elevation in creatinine.  Holding off on losartan and Lasix as well.  Follow-up on renal function in a.m.  Nephrology consult placed.  2.  Fall at nursing home.  Mechanical No evidence of any injury.  No neurological deficit.  No loss of consciousness reported. Physical therapy to evaluate and treat in a.m.  3.  Recent urinary tract  infection Patient was being treated with Bactrim prior to admission.  Repeat urinalysis requested Holding off on Bactrim due to acute kidney injury.  4.  Hypothyroidism Continue Synthroid.  TSH level in a.m.  5.  Diabetes mellitus type 2 Continue long-acting insulin.  Placed on sliding scale insulin coverage.  Glycosylated hemoglobin level in a.m.  6.  Hypertension Blood pressure currently borderline.  Placed on IV fluids.  Holding off on Norvasc   7.  Decreased hearing Primary care physician to refer to ENT as outpatient.  8.  Proximal atrial fibrillation Rate controlled with metoprolol. Anticoagulation contraindicated due to recurrent falls.  DVT prophylaxis; heparin  All the records are reviewed and case discussed with ED provider. Management plans discussed with the patient, family and they are in agreement. Patient clearly wishes to be DNR/DNI as confirmed by son present at bedside.  CODE STATUS: DNR/DNI  TOTAL TIME TAKING CARE OF THIS PATIENT: 69  minutes.    Leotta Weingarten M.D on 06/08/2019 at 2:49 PM  Between 7am to 6pm - Pager - [phone]  After 6pm go to [email or web address] - Proofreader  [facility]  Office  [phone]  CC: Primary care physician; Juluis Pitch, MD   Note: This dictation was prepared with Dragon dictation along with smaller phrase technology. Any transcriptional errors that result from this process are unintentional.

## 2019-06-08 NOTE — ED Triage Notes (Signed)
Pt to ED via [facility] from [facility] main healthcare building for unwitnessed fall and hypotension. EMS reports that they were called out for fall, pt found to have skin tears on both arms and a small laceration to the lip. Pt reported to EMS that he was dizzy when looking up. EMS was unable to get an automatic BP, Pts manual BP was 64 Systolic. Pt was given fluid bolus by EMS. Pt has audible wheezing on assessment. Pt denies chest pain or cough. Pt is in NAd.

## 2019-06-08 NOTE — ED Provider Notes (Signed)
[facility] Emergency Department Provider Note ____________________________________________   First MD Initiated Contact with Patient 06/08/19 1153     (approximate)  I have reviewed the triage vital signs and the nursing notes.  HISTORY  Chief Complaint Fall and Hypotension  EM caveat: Son provides some history as well, patient provides some but is slightly confused and son reports that they feel that he started develop some symptoms of early dementia.  Patient is acting behaving normal for himself  HPI Steven Phelps is a 85 y.o. male here for evaluation after a fall today   Here for evaluation after a fall today.  He fell skin tears on both arms also a small tear on his lip.  Unsure if lost consciousness but does not think so.  No chest pain no trouble breathing.  No pain in the arms or legs except for the skin tears are.  No hip pain or discomfort.  Denies injury to the extremities except for the skin tears  Recently being followed for dehydration, trying to encourage by mouth but son reports that he prefers to drink alcohol occasionally but does not hydrate with water enough  Patient denies pain or discomfort  Past Medical History:  Diagnosis Date  . Arthritis   . Atrial fibrillation ([place])   . Cancer ([place])    skin  . Diabetes mellitus without complication ([place])   . Glaucoma (increased eye pressure)   . Heart murmur   . HLD (hyperlipidemia)   . Hypertension   . Shortness of breath dyspnea     Patient Active Problem List   Diagnosis Date Noted  . Acute kidney injury ([place]) 05/12/2018  . Acute respiratory failure with hypoxia ([place]) 11/22/2015  . Type 2 diabetes mellitus ([place]) 11/22/2015  . HTN (hypertension) 11/22/2015  . HLD (hyperlipidemia) 11/22/2015  . A-fib ([place]) 11/22/2015  . CAP (community acquired pneumonia) 11/22/2015  . 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 Total knee replacement status 06/03/2015    Past Surgical History:  Procedure  Laterality Date  . AORTIC VALVE REPLACEMENT  2010  . CARDIAC CATHETERIZATION    . CHOLECYSTECTOMY    . EYE SURGERY Bilateral    cataract extraction  . TONSILLECTOMY    . TOTAL KNEE ARTHROPLASTY Left 06/03/2015   Procedure: TOTAL KNEE ARTHROPLASTY;  Surgeon: Dereck Leep, MD;  Location: [facility] ORS;  Service: Orthopedics;  Laterality: Left;    Prior to Admission medications   Medication Sig Start Date End Date Taking? Authorizing Provider  amLODipine (NORVASC) 5 MG tablet Take 5 mg by mouth daily.    Yes [provider]  amoxicillin (AMOXIL) 500 MG capsule Take 2,000 mg by mouth. 1 hour prior to procedure   Yes [provider]  aspirin 81 MG tablet Take 1 tablet (81 mg total) by mouth daily. 06/20/15  Yes Watt Climes, PA  atorvastatin (LIPITOR) 40 MG tablet Take 40 mg by mouth daily at 6 PM.   Yes [provider]  clotrimazole (LOTRIMIN) 1 % cream Apply 1 application topically 2 (two) times daily. apply to cracks of buttocks and armpit   Yes [provider]  fenofibrate (TRICOR) 145 MG tablet Take 145 mg by mouth daily.   Yes [provider]  furosemide (LASIX) 20 MG tablet Take 1 tablet (20 mg total) by mouth daily. 05/15/18  Yes Pyreddy, Reatha Harps, MD  insulin glargine (LANTUS) 100 unit/mL SOPN Inject 0.36 mLs (36 Units total) into the skin daily. Patient taking differently: Inject  15 Units into the skin daily.  05/14/18  Yes Pyreddy, Reatha Harps, MD  latanoprost (XALATAN) 0.005 % ophthalmic solution Place 1 drop into both eyes at bedtime.   Yes [provider]  levothyroxine (SYNTHROID) 25 MCG tablet Take 25 mcg by mouth daily before breakfast.   Yes [provider]  losartan (COZAAR) 50 MG tablet Take 1 tablet (50 mg total) by mouth daily. 05/15/18 06/08/19 Yes Pyreddy, Reatha Harps, MD  metoprolol (LOPRESSOR) 100 MG tablet Take 100 mg by mouth 2 (two) times daily.   Yes [provider]  sulfamethoxazole-trimethoprim (BACTRIM DS) 800-160 MG  tablet Take 1 tablet by mouth 2 (two) times daily.   Yes [provider]    Allergies Synvisc [hylan g-f 20] and Zocor [simvastatin]  Family History  Problem Relation Age of Onset  . Diabetes Father   . CAD Mother     Social History Social History   Tobacco Use  . Smoking status: Former Smoker    Packs/day: 0.50    Types: Cigarettes  . Smokeless tobacco: Never Used  Substance Use Topics  . Alcohol use: Yes    Comment: 3 drinks a day  . Drug use: No    Review of Systems Constitutional: No fever/chills or recent illness.  No exposure to COVID Eyes: No visual changes. ENT: No sore throat.  No neck pain. Cardiovascular: Denies chest pain. Respiratory: Denies shortness of breath. Gastrointestinal: No abdominal pain.   Genitourinary: Negative for dysuria. Musculoskeletal: Negative for back pain. Skin: Negative for rash for skin tears. Neurological: Negative for headaches, areas of focal weakness or numbness.    ____________________________________________   PHYSICAL EXAM:  VITAL SIGNS: ED Triage Vitals  Enc Vitals Group     BP 06/08/19 1057 (!) 91/48     Pulse Rate 06/08/19 1057 (!) 57     Resp 06/08/19 1057 18     Temp 06/08/19 1100 98 F (36.7 C)     Temp Source 06/08/19 1100 Oral     SpO2 06/08/19 1057 96 %     Weight --      Height --      Head Circumference --      Peak Flow --      Pain Score 06/08/19 1058 0     Pain Loc --      Pain Edu? --      Excl. in [place]? --     Constitutional: Alert and oriented. Well appearing and in no acute distress. Eyes: Conjunctivae are normal. Head: Atraumatic. Nose: No congestion/rhinnorhea. Mouth/Throat: Mucous membranes are moist.  A very small tear not really a laceration of the upper lip that is minimal does not require any suturing on the inner lip.  No other intraoral or dental injury. Neck: No stridor.  Cardiovascular: Normal rate, regular rhythm. Grossly normal heart sounds.  Good peripheral  circulation. Respiratory: Normal respiratory effort.  No retractions. Lungs CTAB. Gastrointestinal: Soft and nontender. No distention. Musculoskeletal: No lower extremity tenderness nor edema.  Full range of motion of all extremities without pain or discomfort.  About 4 very small superficial skin towards right forearm, 2 small skin tears left forearm. Neurologic:  Normal speech and language. No gross focal neurologic deficits are appreciated.  Skin:  Skin is warm, dry and intact. No rash noted. Psychiatric: Mood and affect are normal. Speech and behavior are normal.  ____________________________________________   LABS (all labs ordered are listed, but only abnormal results are displayed)  Labs Reviewed  CBC WITH DIFFERENTIAL/PLATELET -  Abnormal; Notable for the following components:      Result Value   RBC 3.65 (*)    Hemoglobin 10.7 (*)    HCT 34.2 (*)    All other components within normal limits  BASIC METABOLIC PANEL - Abnormal; Notable for the following components:   Glucose, Bld 119 (*)    BUN 61 (*)    Creatinine, Ser 3.59 (*)    Calcium 8.6 (*)    GFR calc non Af Amer 15 (*)    GFR calc Af Amer 17 (*)    All other components within normal limits  HEPATIC FUNCTION PANEL - Abnormal; Notable for the following components:   Total Protein 6.0 (*)    Albumin 3.2 (*)    Alkaline Phosphatase 36 (*)    All other components within normal limits  SARS CORONAVIRUS 2 (HOSPITAL ORDER, PERFORMED IN [place] [facility])  LIPASE, BLOOD  URINALYSIS, COMPLETE (UACMP) WITH MICROSCOPIC   ____________________________________________  EKG  Reviewed entered by me at 11 AM Heart rate 60 QRS 100 QTc 560 Normal sinus rhythm, mild prolongation of QT interval.  No evidence of acute ischemia ____________________________________________  RADIOLOGY  Ct Head Wo Contrast  Result Date: 06/08/2019 CLINICAL DATA:  Patient status post fall. EXAM: CT HEAD WITHOUT CONTRAST TECHNIQUE:  Contiguous axial images were obtained from the base of the skull through the vertex without intravenous contrast. COMPARISON:  Brain CT 05/12/2018 FINDINGS: Brain: Ventricles and sulci are prominent compatible with atrophy. Periventricular and subcortical white matter hypodensities compatible with chronic microvascular ischemic changes. No evidence for acute cortically based infarct, intracranial hemorrhage, mass lesion or mass-effect. Vascular: Unremarkable Skull: Intact. Sinuses/Orbits: Paranasal sinuses are well aerated. Opacification of the right mastoid air cells. Left mastoid air cells are unremarkable. Other: None. IMPRESSION: No acute intracranial process. Atrophy and chronic microvascular ischemic changes. Opacification right mastoid air cells. Electronically Signed   By: Lovey Newcomer M.D.   On: 06/08/2019 12:49    ____________________________________________   PROCEDURES  Procedure(s) performed: None  Procedures  Critical Care performed: No  ____________________________________________   INITIAL IMPRESSION / ASSESSMENT AND PLAN / ED COURSE  Pertinent labs & imaging results that were available during my care of the patient were reviewed by me and considered in my medical decision making (see chart for details).   Patient presents after a fall.  Mild hypotension.  Notably elevated creatinine, also elevated over the baseline which is primary is been checking noted in the [facility] system  He appears dehydrated.  No major injury after the fall.  Head CT negative for acute.  Denies acute cardiac or pulmonary symptoms.  Denies infectious symptoms.  No noted risk factors for COVID except send rapid as he does reside believe in a assisted living type center.  We will admit patient because of acute kidney injury, dehydration.  Discussed with hospitalist, nurse practitioner Benjamine Mola.  Renal ultrasound pending at time of admission, hospitalist will follow up on this.  Also urinalysis pending       ____________________________________________   FINAL CLINICAL IMPRESSION(S) / ED DIAGNOSES  Final diagnoses:  Dehydration  AKI (acute kidney injury) ([place])  Fall, initial encounter  Multiple skin tears        Note:  This document was prepared using Dragon voice recognition software and may include unintentional dictation errors       Delman Kitten, MD 06/08/19 1345

## 2019-06-09 ENCOUNTER — Other Ambulatory Visit: Payer: Self-pay

## 2019-06-09 LAB — URINALYSIS, COMPLETE (UACMP) WITH MICROSCOPIC
Bacteria, UA: NONE SEEN
Bilirubin Urine: NEGATIVE
Glucose, UA: NEGATIVE mg/dL
Hgb urine dipstick: NEGATIVE
Ketones, ur: NEGATIVE mg/dL
Nitrite: NEGATIVE
Protein, ur: NEGATIVE mg/dL
Specific Gravity, Urine: 1.013 (ref 1.005–1.030)
pH: 5 (ref 5.0–8.0)

## 2019-06-09 LAB — CBC
HCT: 33.1 % — ABNORMAL LOW (ref 39.0–52.0)
Hemoglobin: 10.2 g/dL — ABNORMAL LOW (ref 13.0–17.0)
MCH: 29.7 pg (ref 26.0–34.0)
MCHC: 30.8 g/dL (ref 30.0–36.0)
MCV: 96.2 fL (ref 80.0–100.0)
Platelets: 233 10*3/uL (ref 150–400)
RBC: 3.44 MIL/uL — ABNORMAL LOW (ref 4.22–5.81)
RDW: 14.6 % (ref 11.5–15.5)
WBC: 6.5 10*3/uL (ref 4.0–10.5)
nRBC: 0 % (ref 0.0–0.2)

## 2019-06-09 LAB — MAGNESIUM: Magnesium: 2.3 mg/dL (ref 1.7–2.4)

## 2019-06-09 LAB — IRON AND TIBC
Iron: 81 ug/dL (ref 45–182)
Saturation Ratios: 22 % (ref 17.9–39.5)
TIBC: 371 ug/dL (ref 250–450)
UIBC: 290 ug/dL

## 2019-06-09 LAB — BASIC METABOLIC PANEL
Anion gap: 4 — ABNORMAL LOW (ref 5–15)
BUN: 54 mg/dL — ABNORMAL HIGH (ref 8–23)
CO2: 23 mmol/L (ref 22–32)
Calcium: 8.5 mg/dL — ABNORMAL LOW (ref 8.9–10.3)
Chloride: 114 mmol/L — ABNORMAL HIGH (ref 98–111)
Creatinine, Ser: 3.17 mg/dL — ABNORMAL HIGH (ref 0.61–1.24)
GFR calc Af Amer: 20 mL/min — ABNORMAL LOW (ref >60)
GFR calc non Af Amer: 17 mL/min — ABNORMAL LOW (ref >60)
Glucose, Bld: 90 mg/dL (ref 70–99)
Potassium: 4.9 mmol/L (ref 3.5–5.1)
Sodium: 141 mmol/L (ref 135–145)

## 2019-06-09 LAB — GLUCOSE, CAPILLARY
Glucose-Capillary: 74 mg/dL (ref 70–99)
Glucose-Capillary: 111 mg/dL — ABNORMAL HIGH (ref 70–99)
Glucose-Capillary: 100 mg/dL — ABNORMAL HIGH (ref 70–99)
Glucose-Capillary: 93 mg/dL (ref 70–99)

## 2019-06-09 LAB — PHOSPHORUS: Phosphorus: 3.5 mg/dL (ref 2.5–4.6)

## 2019-06-09 LAB — FERRITIN: Ferritin: 286 ng/mL (ref 24–336)

## 2019-06-09 LAB — MRSA PCR SCREENING: MRSA by PCR: NEGATIVE

## 2019-06-09 LAB — PROTEIN / CREATININE RATIO, URINE
Creatinine, Urine: 75 mg/dL
Protein Creatinine Ratio: 0.13 mg/mg{Cre} (ref 0.00–0.15)
Total Protein, Urine: 10 mg/dL

## 2019-06-09 LAB — HEMOGLOBIN A1C
Hgb A1c MFr Bld: 8.2 % — ABNORMAL HIGH (ref 4.8–5.6)
Mean Plasma Glucose: 188.64 mg/dL

## 2019-06-09 LAB — TSH: TSH: 1.398 u[IU]/mL (ref 0.350–4.500)

## 2019-06-09 NOTE — Progress Notes (Signed)
Advanced Directives Documents (Living Will, Power of Attorney) currently in the EHR no advanced directives documents available .  Has the patient discussed their wishes with their family/healthcare power of attorney yes. How much does the family or healthcare power of attorney know about their wishes. Patient son present at bedside understands patient has multiple medical problems.  He agrees with patient's decision to be DNR/DNI.  What does the patient/decision maker understand about their medical condition and the natural course of their disease.  Acute kidney injury.  Fall.  Diabetes mellitus.  Hypertension.  Decreased hearing.  Hypothyroidism.  What is the patient/decision maker's biggest fear or concern for the future pain and suffering   What is the most important goal for this patient should their health condition worsen maintenance of function.  Current   Code Status: Full Code  Current code status has been reviewed/updated.  Time spent:19minutes

## 2019-06-09 NOTE — Progress Notes (Addendum)
[facility] [place] NAME: Steven Phelps    MR#:  [id number]  DATE OF BIRTH:  07/19/1931  SUBJECTIVE:  CHIEF COMPLAINT:   Chief Complaint  Patient presents with  . Fall  . Hypotension   No new complaint this morning.  Resting comfortably.  Patient with decreased hearing at baseline.  REVIEW OF SYSTEMS:  Review of Systems  Constitutional: Negative for chills and fever.  HENT: Positive for hearing loss. Negative for tinnitus.   Eyes: Negative for blurred vision and double vision.  Respiratory: Negative for shortness of breath.   Cardiovascular: Negative for chest pain and palpitations.  Gastrointestinal: Negative for heartburn and nausea.  Genitourinary: Negative for dysuria.  Musculoskeletal: Negative for back pain and myalgias.  Skin: Negative for itching and rash.  Neurological: Negative for dizziness and headaches.  Psychiatric/Behavioral: Negative for depression and hallucinations.    DRUG ALLERGIES:   Allergies  Allergen Reactions  . Synvisc [Hylan G-F 20] Other (See Comments)    "severe pain in knee"  . Zocor [Simvastatin] Other (See Comments)    "muscle aches in joints"   VITALS:  Blood pressure 110/62, pulse 69, temperature 98 F (36.7 C), temperature source Oral, resp. rate 18, SpO2 98 %. PHYSICAL EXAMINATION:  Physical Exam  GENERAL:  85 y.o.-year-old patient lying in the bed with no acute distress.  EYES: Pupils equal, round, reactive to light and accommodation. No scleral icterus. Extraocular muscles intact.  HEENT: Significantly decreased hearing.  Dry oral mucosa.    NECK:  Supple, no jugular venous distention. No thyroid enlargement, no tenderness.  LUNGS: Normal breath sounds bilaterally, no wheezing, rales,rhonchi or crepitation. No use of accessory muscles of respiration.  CARDIOVASCULAR: S1, S2 normal. No murmurs, rubs, or gallops.  ABDOMEN: Soft, nontender, nondistended. Bowel sounds present. No  organomegaly or mass.  EXTREMITIES: Slight laceration around the left elbow joint following fall.  No pedal edema, cyanosis, or clubbing.  NEUROLOGIC: Cranial nerves II through XII are intact. Muscle strength 5/5 in all extremities. Sensation intact. Gait not checked.  PSYCHIATRIC: The patient is alert and oriented x 3.  SKIN: Dry skin.  No obvious rash, lesion, or ulcer.   LABORATORY PANEL:  Male CBC Recent Labs  Lab 06/09/19 0415  WBC 6.5  HGB 10.2*  HCT 33.1*  PLT 233   ------------------------------------------------------------------------------------------------------------------ Chemistries  Recent Labs  Lab 06/08/19 1115 06/09/19 0415  NA  --  141  K  --  4.9  CL  --  114*  CO2  --  23  GLUCOSE  --  90  BUN  --  54*  CREATININE  --  3.17*  CALCIUM  --  8.5*  MG  --  2.3  AST 30  --   ALT 21  --   ALKPHOS 36*  --   BILITOT 0.5  --    RADIOLOGY:  Ct Head Wo Contrast  Result Date: 06/08/2019 CLINICAL DATA:  Patient status post fall. EXAM: CT HEAD WITHOUT CONTRAST TECHNIQUE: Contiguous axial images were obtained from the base of the skull through the vertex without intravenous contrast. COMPARISON:  Brain CT 05/12/2018 FINDINGS: Brain: Ventricles and sulci are prominent compatible with atrophy. Periventricular and subcortical white matter hypodensities compatible with chronic microvascular ischemic changes. No evidence for acute cortically based infarct, intracranial hemorrhage, mass lesion or mass-effect. Vascular: Unremarkable Skull: Intact. Sinuses/Orbits: Paranasal sinuses are well aerated. Opacification of the right mastoid air cells. Left mastoid air cells are unremarkable. Other: None.  IMPRESSION: No acute intracranial process. Atrophy and chronic microvascular ischemic changes. Opacification right mastoid air cells. Electronically Signed   By: Lovey Newcomer M.D.   On: 06/08/2019 12:49   US Renal  Result Date: 06/08/2019 CLINICAL DATA:  Patient with elevated  creatinine. EXAM: RENAL / URINARY TRACT ULTRASOUND COMPLETE COMPARISON:  None. FINDINGS: Right Kidney: Renal measurements: 10.4 x 5.2 x 6.4 cm = volume: 184 mL. Mild renal cortical thinning. Normal renal cortical echogenicity. No hydronephrosis. Left Kidney: Renal measurements: 11.5 x 5.0 x 7.5 cm = volume: 225 mL. Mild renal cortical thinning. Normal renal cortical echogenicity. No hydronephrosis. Bladder: Appears normal for degree of bladder distention. Prevoid: 226 cc IMPRESSION: No hydronephrosis. Mild renal cortical thinning. Electronically Signed   By: Lovey Newcomer M.D.   On: 06/08/2019 13:48   ASSESSMENT AND PLAN:   Patient is an 85 year old male with history of chronic atrial fibrillation, hypertension, diabetes mellitus, glaucoma, hyperlipidemia, decreased hearing, hypothyroidism and recent treatment for urinary tract infection with antibiotics with Bactrim who was brought into the emergency room today from nursing home following fall.  Diagnosed with acute kidney injury.   1.  Acute kidney injury Most likely prerenal from dehydration.  Decreased p.o. intake.  Noted creatinine of 3.59 on admission.  Last creatinine was 1.04 on 05/2018. Renal function gradually improving with IV fluids with creatinine down to 3.17. Renal ultrasound done with no hydronephrosis.  Avoiding nephrotoxic agents.  Discontinued Bactrim which patient was taken for recent UTI prior to this admission.  This can cause elevation in creatinine.  Holding off on losartan and Lasix as well.  Follow-up on renal function in a.m.   Follow-up on nephrology input .  2.  Fall at nursing home.  Mechanical No evidence of any injury.  No neurological deficit.  No loss of consciousness reported. Physical therapy to evaluate and treat today  3.  Recent urinary tract infection Patient was being treated with Bactrim prior to admission.  Repeat urinalysis requested Holding off on Bactrim due to acute kidney injury.  4.   Hypothyroidism Continue Synthroid.  TSH level normal  5.  Diabetes mellitus type 2 Continue long-acting insulin.  Placed on sliding scale insulin coverage.  Glycosylated hemoglobin level in a.m.  6.  Hypertension Blood pressure stable.    Norvasc was previously placed on hold due to blood pressure being borderline.   Metoprolol held this morning due to orthostatic hypotension  7.  Decreased hearing Primary care physician to refer to ENT as outpatient.  8.  Proximal atrial fibrillation Rate controlled with metoprolol but this was held this morning due to orthostatic hypotension. Anticoagulation contraindicated due to recurrent falls.  DVT prophylaxis; heparin   All the records are reviewed and case discussed with Care Management/Social Worker. Management plans discussed with the patient, family and they are in agreement.  CODE STATUS: DNR  TOTAL TIME TAKING CARE OF THIS PATIENT: 39 minutes.   More than 50% of the time was spent in counseling/coordination of care: YES  POSSIBLE D/C IN 2 DAYS, DEPENDING ON CLINICAL CONDITION.   Tylik Treese M.D on 06/09/2019 at 10:35 AM  Between 7am to 6pm - Pager - [phone]  After 6pm go to [email or web address] - Proofreader  [facility]  Office  [phone]  CC: Primary care physician; Juluis Pitch, MD  Note: This dictation was prepared with Dragon dictation along with smaller phrase technology. Any transcriptional errors that result from this process are unintentional.

## 2019-06-09 NOTE — Progress Notes (Signed)
MD notified: Do you know what will be the plan for this patient other than the patient being seen by PT.

## 2019-06-09 NOTE — Evaluation (Signed)
Physical Therapy Evaluation Patient Details Name: Steven Phelps MRN: [id number] DOB: May 06, 1931 Today's Date: 06/09/2019   History of Present Illness  Steven Phelps is an 76yoM comes to [facility] on 7/4 after a fall at [facility] associated with dizziness. Pt found to have AKI and dehydration. PMH: AF, AVR, Lt TKA, HOH. Pt was here 8month ago for a fall, but reports no other falls since; Baseline amb with SPC.  Clinical Impression  Pt admitted with above diagnosis. Pt currently with functional limitations due to the deficits listed below (see "PT Problem List"). Upon entry, pt in bed, awake and agreeable to participate. Pt very HOH, author using a white board to facilitate most communication. MinA to EOB, minA to stand, AMB with RW without instability of gait, but distance limited 2/2 dizziness and orthostatic drop to 80s/50s. Functional mobility assessment demonstrates increased effort/time requirements, poor tolerance, and need for physical assistance, whereas the patient performed these at a higher level of independence PTA. Pt will benefit from skilled PT intervention to increase independence and safety with basic mobility in preparation for discharge to the venue listed below.       Follow Up Recommendations Home health PT;Supervision for mobility/OOB    Equipment Recommendations  Rolling walker with 5" wheels    Recommendations for Other Services       Precautions / Restrictions Precautions Precautions: Fall Restrictions Weight Bearing Restrictions: No      Mobility  Bed Mobility Overal bed mobility: Needs Assistance Bed Mobility: Supine to Sit     Supine to sit: Min assist     General bed mobility comments: labored and weak  Transfers Overall transfer level: Needs assistance Equipment used: Rolling walker (2 wheeled) Transfers: Sit to/from Stand Sit to Stand: Min assist         General transfer comment: dizzy upon standing  Ambulation/Gait Ambulation/Gait  assistance: Min guard Gait Distance (Feet): 14 Feet Assistive device: Rolling walker (2 wheeled) Gait Pattern/deviations: WFL(Within Functional Limits)     General Gait Details: dizzy upon AMB inititation, hence rerouted to chair, rather than to hallway. Subsequent BP assessed in sitting then standing again with (+) orthostatic drop  Stairs            Wheelchair Mobility    Modified Rankin (Stroke Patients Only)       Balance Overall balance assessment: Mild deficits observed, not formally tested;Modified Independent;History of Falls                                           Pertinent Vitals/Pain Pain Assessment: No/denies pain    Home Living Family/patient expects to be discharged to:: Assisted living               Home Equipment: Kasandra Knudsen - single point Additional Comments: Douglass Rivers for >1 year.    Prior Function Level of Independence: Independent with assistive device(s)         Comments: limited community distance AMB with SPC, independent with ADL, most IADL.     Hand Dominance        Extremity/Trunk Assessment   Upper Extremity Assessment Upper Extremity Assessment: Generalized weakness;Overall [facility] for tasks assessed    Lower Extremity Assessment Lower Extremity Assessment: Generalized weakness;Overall WFL for tasks assessed       Communication   Communication: HOH  Cognition Arousal/Alertness: Awake/alert Behavior During Therapy: WFL for tasks assessed/performed Overall Cognitive  Status: History of cognitive impairments - at baseline                                 General Comments: mild memory recall difficulty      General Comments      Exercises     Assessment/Plan    PT Assessment Patient needs continued PT services  PT Problem List Decreased strength;Decreased activity tolerance;Decreased mobility;Decreased knowledge of precautions;Decreased cognition       PT Treatment Interventions  Therapeutic exercise;DME instruction;Functional mobility training;Therapeutic activities;Patient/family education;Cognitive remediation;Neuromuscular re-education;Gait training;Balance training    PT Goals (Current goals can be found in the Care Plan section)  Acute Rehab PT Goals Patient Stated Goal: recover strength, stop falling PT Goal Formulation: With patient Time For Goal Achievement: 06/23/19 Potential to Achieve Goals: Good    Frequency Min 2X/week   Barriers to discharge Decreased caregiver support ALF cannot provide 1:1 support for mobility    Co-evaluation               AM-PAC PT "6 Clicks" Mobility  Outcome Measure Help needed turning from your back to your side while in a flat bed without using bedrails?: A Little Help needed moving from lying on your back to sitting on the side of a flat bed without using bedrails?: A Little Help needed moving to and from a bed to a chair (including a wheelchair)?: A Little Help needed standing up from a chair using your arms (e.g., wheelchair or bedside chair)?: A Little Help needed to walk in hospital room?: A Little Help needed climbing 3-5 steps with a railing? : A Little 6 Click Score: 18    End of Session Equipment Utilized During Treatment: Gait belt Activity Tolerance: Patient tolerated treatment well;No increased pain;Treatment limited secondary to medical complications (Comment)((+) orthos) Patient left: in chair;with call bell/phone within reach;with chair alarm set Nurse Communication: Mobility status PT Visit Diagnosis: Unsteadiness on feet (R26.81);History of falling (Z91.81);Other symptoms and signs involving the nervous system (R29.898);Difficulty in walking, not elsewhere classified (R26.2);Dizziness and giddiness (R42)    Time: 9767-3419 PT Time Calculation (min) (ACUTE ONLY): 32 min   Charges:   PT Evaluation $PT Eval Moderate Complexity: 1 Mod PT Treatments $Therapeutic Exercise: 8-22 mins         12:09 PM, 06/09/19 Etta Grandchild, PT, DPT Physical Therapist - [facility]  [phone] ([place])   Ronin Rehfeldt C 06/09/2019, 12:06 PM

## 2019-06-09 NOTE — Consult Note (Signed)
[facility] CONSULT NOTE    Date: 06/09/2019                  Patient Name:  Steven Phelps  MRN: [id number]  DOB: 10/23/1931  Age / Sex: 85 y.o., male         PCP: Juluis Pitch, MD                 Service Requesting Consult:  Hospitalist                 Reason for Consult:  Acute renal failure in the setting of known chronic kidney disease stage III            History of Present Illness: Patient is a 85 y.o. male with a PMHx of osteoarthritis, atrial fibrillation, diabetes mellitus type 2, glaucoma, hyperlipidemia, hypertension, chronic kidney disease stage III baseline creatinine 1.4, who was admitted to [facility] on 06/08/2019 for evaluation of fall and hypotension.  Patient resides at [facility].  He states that several days prior to admission he has not been eating or drinking as well as he normally does.  Patient found to be hypotensive here and also complains of dizziness.  Patient noted to be on amlodipine, furosemide, losartan, and metoprolol.  His baseline creatinine is 1.4 on December 21, 2018.  Creatinine upon presentation was found to 3.59.  Now with IV fluid hydration creatinine down to 3.17 with a BUN of 54.  Renal ultrasound was performed and was negative for hydronephrosis.  There was mild bilateral renal cortical thinning consistent with chronic kidney disease.  Patient extremely hard of hearing.   Medications: Outpatient medications: Medications Prior to Admission  Medication Sig Dispense Refill Last Dose  . amLODipine (NORVASC) 5 MG tablet Take 5 mg by mouth daily.    06/08/2019 at 0800  . amoxicillin (AMOXIL) 500 MG capsule Take 2,000 mg by mouth. 1 hour prior to procedure   prn at prn  . aspirin 81 MG tablet Take 1 tablet (81 mg total) by mouth daily. 1 tablet 0 06/08/2019 at 0800  . atorvastatin (LIPITOR) 40 MG tablet Take 40 mg by mouth daily at 6 PM.   06/07/2019 at 1800  . clotrimazole (LOTRIMIN) 1 % cream Apply 1 application topically 2 (two)  times daily. apply to cracks of buttocks and armpit   06/08/2019 at 0800  . fenofibrate (TRICOR) 145 MG tablet Take 145 mg by mouth daily.   06/07/2019 at 1900  . furosemide (LASIX) 20 MG tablet Take 1 tablet (20 mg total) by mouth daily. 30 tablet 1 06/08/2019 at 0800  . insulin glargine (LANTUS) 100 unit/mL SOPN Inject 0.36 mLs (36 Units total) into the skin daily. (Patient taking differently: Inject 15 Units into the skin daily. ) 15 mL 11 06/08/2019 at 0800  . latanoprost (XALATAN) 0.005 % ophthalmic solution Place 1 drop into both eyes at bedtime.   06/07/2019 at 1900  . levothyroxine (SYNTHROID) 25 MCG tablet Take 25 mcg by mouth daily before breakfast.   06/08/2019 at 0700  . losartan (COZAAR) 50 MG tablet Take 1 tablet (50 mg total) by mouth daily. 30 tablet 1 06/08/2019 at 0800  . metoprolol (LOPRESSOR) 100 MG tablet Take 100 mg by mouth 2 (two) times daily.   06/08/2019 at 0800  . sulfamethoxazole-trimethoprim (BACTRIM DS) 800-160 MG tablet Take 1 tablet by mouth 2 (two) times daily.   06/08/2019 at 0800    Current medications: Current Facility-Administered Medications  Medication  Dose Route Frequency Provider Last Rate Last Dose  . 0.9 %  sodium chloride infusion   Intravenous Continuous Ojie, Jude, MD 100 mL/hr at 06/09/19 1134    . aspirin EC tablet 81 mg  81 mg Oral Daily Ojie, Jude, MD   81 mg at 06/09/19 1059  . atorvastatin (LIPITOR) tablet 40 mg  40 mg Oral q1800 Ojie, Jude, MD   40 mg at 06/08/19 1739  . clotrimazole (LOTRIMIN) 1 % cream 1 application  1 application Topical BID Stark Jock, Jude, MD   1 application at 48/18/56 1057  . fenofibrate tablet 54 mg  54 mg Oral Daily Ojie, Jude, MD   54 mg at 06/08/19 1739  . heparin injection 5,000 Units  5,000 Units Subcutaneous Q8H Ojie, Jude, MD   5,000 Units at 06/09/19 0514  . insulin aspart (novoLOG) injection 0-5 Units  0-5 Units Subcutaneous QHS Ojie, Jude, MD      . insulin aspart (novoLOG) injection 0-9 Units  0-9 Units Subcutaneous TID WC Ojie,  Jude, MD      . insulin glargine (LANTUS) injection 36 Units  36 Units Subcutaneous Daily Ojie, Jude, MD   36 Units at 06/09/19 1058  . latanoprost (XALATAN) 0.005 % ophthalmic solution 1 drop  1 drop Both Eyes QHS Ojie, Jude, MD   1 drop at 06/08/19 2240  . levothyroxine (SYNTHROID) tablet 25 mcg  25 mcg Oral QAC breakfast Stark Jock, Jude, MD   25 mcg at 06/09/19 0515  . metoprolol tartrate (LOPRESSOR) tablet 100 mg  100 mg Oral BID Stark Jock, Jude, MD   100 mg at 06/08/19 2240      Allergies: Allergies  Allergen Reactions  . Synvisc [Hylan G-F 20] Other (See Comments)    "severe pain in knee"  . Zocor [Simvastatin] Other (See Comments)    "muscle aches in joints"      Past Medical History: Past Medical History:  Diagnosis Date  . Arthritis   . Atrial fibrillation ([place])   . Cancer ([place])    skin  . Diabetes mellitus without complication ([place])   . Glaucoma (increased eye pressure)   . Heart murmur   . HLD (hyperlipidemia)   . Hypertension   . Shortness of breath dyspnea      Past Surgical History: Past Surgical History:  Procedure Laterality Date  . AORTIC VALVE REPLACEMENT  2010  . CARDIAC CATHETERIZATION    . CHOLECYSTECTOMY    . EYE SURGERY Bilateral    cataract extraction  . TONSILLECTOMY    . TOTAL KNEE ARTHROPLASTY Left 06/03/2015   Procedure: TOTAL KNEE ARTHROPLASTY;  Surgeon: Dereck Leep, MD;  Location: [facility] ORS;  Service: Orthopedics;  Laterality: Left;     Family History: Family History  Problem Relation Age of Onset  . Diabetes Father   . CAD Mother      Social History: Social History   Socioeconomic History  . Marital status: Divorced    Spouse name: Not on file  . Number of children: Not on file  . Years of education: Not on file  . Highest education level: Not on file  Occupational History  . Not on file  Social Needs  . Financial resource strain: Not on file  . Food insecurity    Worry: Not on file    Inability: Not on file  .  Transportation needs    Medical: Not on file    Non-medical: Not on file  Tobacco Use  . Smoking status: Former Smoker  Packs/day: 0.50    Types: Cigarettes  . Smokeless tobacco: Never Used  Substance and Sexual Activity  . Alcohol use: Yes    Comment: 3 drinks a day  . Drug use: No  . Sexual activity: Not on file  Lifestyle  . Physical activity    Days per week: Not on file    Minutes per session: Not on file  . Stress: Not on file  Relationships  . Social Herbalist on phone: Not on file    Gets together: Not on file    Attends religious service: Not on file    Active member of club or organization: Not on file    Attends meetings of clubs or organizations: Not on file    Relationship status: Not on file  . Intimate partner violence    Fear of current or ex partner: Not on file    Emotionally abused: Not on file    Physically abused: Not on file    Forced sexual activity: Not on file  Other Topics Concern  . Not on file  Social History Narrative  . Not on file     Review of Systems: Review of Systems  Constitutional: Negative for chills, fever and malaise/fatigue.  HENT: Positive for hearing loss. Negative for nosebleeds and tinnitus.   Eyes: Negative for blurred vision and double vision.  Respiratory: Negative for cough, hemoptysis and sputum production.   Cardiovascular: Negative for chest pain, palpitations and orthopnea.  Gastrointestinal: Negative for heartburn, nausea and vomiting.  Genitourinary: Negative for dysuria and urgency.  Musculoskeletal: Positive for falls. Negative for back pain.  Skin: Negative for itching and rash.  Neurological: Positive for dizziness.  Endo/Heme/Allergies: Negative for polydipsia. Does not bruise/bleed easily.  Psychiatric/Behavioral: Negative for depression. The patient is not nervous/anxious.      Vital Signs: Blood pressure (!) 98/53, pulse 63, temperature 97.6 F (36.4 C), temperature source Oral, resp.  rate 18, SpO2 93 %.  Weight trends: There were no vitals filed for this visit.  Physical Exam: General: NAD, sitting up in chair  Head: Normocephalic, atraumatic.  Eyes: Anicteric, EOMI  Nose: Mucous membranes moist, not inflammed, nonerythematous.  Throat: Oropharynx nonerythematous, no exudate appreciated.   Neck: Supple, trachea midline.  Lungs:  Normal respiratory effort. Clear to auscultation BL without crackles or wheezes.  Heart: RRR. S1 and S2 normal without gallop, murmur, or rubs.  Abdomen:  BS normoactive. Soft, Nondistended, non-tender.  No masses or organomegaly.  Extremities: No pretibial edema.  Neurologic: A&O X3, very hard of hearing  Skin: No visible rashes, scars.    Lab results: Basic Metabolic Panel: Recent Labs  Lab 06/08/19 1102 06/09/19 0415  NA 139 141  K 4.7 4.9  CL 107 114*  CO2 23 23  GLUCOSE 119* 90  BUN 61* 54*  CREATININE 3.59* 3.17*  CALCIUM 8.6* 8.5*  MG 2.4 2.3  PHOS  --  3.5    Liver Function Tests: Recent Labs  Lab 06/08/19 1115  AST 30  ALT 21  ALKPHOS 36*  BILITOT 0.5  PROT 6.0*  ALBUMIN 3.2*   Recent Labs  Lab 06/08/19 1115  LIPASE 28   No results for input(s): AMMONIA in the last 168 hours.  CBC: Recent Labs  Lab 06/08/19 1102 06/09/19 0415  WBC 6.6 6.5  NEUTROABS 4.5  --   HGB 10.7* 10.2*  HCT 34.2* 33.1*  MCV 93.7 96.2  PLT 251 233    Cardiac Enzymes: No results for  input(s): CKTOTAL, CKMB, CKMBINDEX, TROPONINI in the last 168 hours.  BNP: Invalid input(s): POCBNP  CBG: Recent Labs  Lab 06/08/19 1705 06/08/19 2157 06/09/19 0752 06/09/19 1133  GLUCAP 81 111* 74 111*    Microbiology: Results for orders placed or performed during the hospital encounter of 06/08/19  SARS Coronavirus 2 (CEPHEID - Performed in [place] hospital lab), Hosp Order     Status: None   Collection Time: 06/08/19 12:03 PM   Specimen: Nasopharyngeal Swab  Result Value Ref Range Status   SARS Coronavirus 2 NEGATIVE  NEGATIVE Final    Comment: (NOTE) If result is NEGATIVE SARS-CoV-2 target nucleic acids are NOT DETECTED. The SARS-CoV-2 RNA is generally detectable in upper and lower  respiratory specimens during the acute phase of infection. The lowest  concentration of SARS-CoV-2 viral copies this assay can detect is 250  copies / mL. A negative result does not preclude SARS-CoV-2 infection  and should not be used as the sole basis for treatment or other  patient management decisions.  A negative result may occur with  improper specimen collection / handling, submission of specimen other  than nasopharyngeal swab, presence of viral mutation(s) within the  areas targeted by this assay, and inadequate number of viral copies  (<250 copies / mL). A negative result must be combined with clinical  observations, patient history, and epidemiological information. If result is POSITIVE SARS-CoV-2 target nucleic acids are DETECTED. The SARS-CoV-2 RNA is generally detectable in upper and lower  respiratory specimens dur ing the acute phase of infection.  Positive  results are indicative of active infection with SARS-CoV-2.  Clinical  correlation with patient history and other diagnostic information is  necessary to determine patient infection status.  Positive results do  not rule out bacterial infection or co-infection with other viruses. If result is PRESUMPTIVE POSTIVE SARS-CoV-2 nucleic acids MAY BE PRESENT.   A presumptive positive result was obtained on the submitted specimen  and confirmed on repeat testing.  While 2019 novel coronavirus  (SARS-CoV-2) nucleic acids may be present in the submitted sample  additional confirmatory testing may be necessary for epidemiological  and / or clinical management purposes  to differentiate between  SARS-CoV-2 and other Sarbecovirus currently known to infect humans.  If clinically indicated additional testing with an alternate test  methodology [phone]) is  advised. The SARS-CoV-2 RNA is generally  detectable in upper and lower respiratory sp ecimens during the acute  phase of infection. The expected result is Negative. Fact Sheet for Patients:  StrictlyIdeas.no Fact Sheet for Healthcare Providers: BankingDealers.co.za This test is not yet approved or cleared by the [place] FDA and has been authorized for detection and/or diagnosis of SARS-CoV-2 by FDA under an Emergency Use Authorization (EUA).  This EUA will remain in effect (meaning this test can be used) for the duration of the COVID-19 declaration under Section 564(b)(1) of the Act, 21 U.S.C. section 360bbb-3(b)(1), unless the authorization is terminated or revoked sooner. Performed at [facility], [address] [place], [place] [address]   MRSA PCR Screening     Status: None   Collection Time: 06/09/19  5:30 AM   Specimen: Nasopharyngeal  Result Value Ref Range Status   MRSA by PCR NEGATIVE NEGATIVE Final    Comment:        The GeneXpert MRSA Assay (FDA approved for NASAL specimens only), is one component of a comprehensive MRSA colonization surveillance program. It is not intended to diagnose MRSA infection nor to guide  or monitor treatment for MRSA infections. Performed at [facility], [address] [place], [place] [address]     Coagulation Studies: No results for input(s): LABPROT, INR in the last 72 hours.  Urinalysis: Recent Labs    06/09/19 0530  COLORURINE YELLOW*  LABSPEC 1.013  PHURINE 5.0  GLUCOSEU NEGATIVE  HGBUR NEGATIVE  BILIRUBINUR NEGATIVE  KETONESUR NEGATIVE  PROTEINUR NEGATIVE  NITRITE NEGATIVE  LEUKOCYTESUR TRACE*      Imaging: Ct Head Wo Contrast  Result Date: 06/08/2019 CLINICAL DATA:  Patient status post fall. EXAM: CT HEAD WITHOUT CONTRAST TECHNIQUE: Contiguous axial images were obtained from the base of the skull through the vertex without intravenous  contrast. COMPARISON:  Brain CT 05/12/2018 FINDINGS: Brain: Ventricles and sulci are prominent compatible with atrophy. Periventricular and subcortical white matter hypodensities compatible with chronic microvascular ischemic changes. No evidence for acute cortically based infarct, intracranial hemorrhage, mass lesion or mass-effect. Vascular: Unremarkable Skull: Intact. Sinuses/Orbits: Paranasal sinuses are well aerated. Opacification of the right mastoid air cells. Left mastoid air cells are unremarkable. Other: None. IMPRESSION: No acute intracranial process. Atrophy and chronic microvascular ischemic changes. Opacification right mastoid air cells. Electronically Signed   By: Lovey Newcomer M.D.   On: 06/08/2019 12:49   US Renal  Result Date: 06/08/2019 CLINICAL DATA:  Patient with elevated creatinine. EXAM: RENAL / URINARY TRACT ULTRASOUND COMPLETE COMPARISON:  None. FINDINGS: Right Kidney: Renal measurements: 10.4 x 5.2 x 6.4 cm = volume: 184 mL. Mild renal cortical thinning. Normal renal cortical echogenicity. No hydronephrosis. Left Kidney: Renal measurements: 11.5 x 5.0 x 7.5 cm = volume: 225 mL. Mild renal cortical thinning. Normal renal cortical echogenicity. No hydronephrosis. Bladder: Appears normal for degree of bladder distention. Prevoid: 226 cc IMPRESSION: No hydronephrosis. Mild renal cortical thinning. Electronically Signed   By: Lovey Newcomer M.D.   On: 06/08/2019 13:48      Assessment & Plan: Pt is a 85 y.o. male with a PMHx of osteoarthritis, atrial fibrillation, diabetes mellitus type 2, glaucoma, hyperlipidemia, hypertension, chronic kidney disease stage III baseline creatinine 1.4, who was admitted to [facility] on 06/08/2019 for evaluation of fall and hypotension.  Patient resides at [facility].   1.  Acute renal failure/chronic kidney disease stage III baseline creatinine 1.4.  Suspect acute renal failure is due to dehydration and hypotension.  Patient was on amlodipine, furosemide,  losartan, and metoprolol as an outpatient.  Agree with holding furosemide, losartan, and amlodipine as the patient is orthostatic.  Continue IV fluid hydration.  Renal ultrasound was performed and was negative for hydronephrosis.  Bilateral renal cortical thinning noted.  No indication for dialysis.  2.  Hypotension.  Suspect due to dehydration.  Agree with holding losartan, amlodipine, and furosemide.  3.  Anemia of chronic kidney disease.  Hemoglobin 10.2.  Check SPEP and UPEP as well as iron studies.

## 2019-06-09 NOTE — Progress Notes (Signed)
Orthostatic Note, taken during PT evaluation. RN made aware.     06/09/19 1007  Therapy Vitals  Patient Position (if appropriate) Orthostatic Vitals  Orthostatic Sitting  BP- Sitting 110/51  Pulse- Sitting 67  Orthostatic Standing at 0 minutes  BP- Standing at 0 minutes (!) 81/58 (mildly dizzy)  Pulse- Standing at 0 minutes 71    10:08 AM, 06/09/19 Etta Grandchild, PT, DPT Physical Therapist - [facility]  [phone] [facility])

## 2019-06-09 NOTE — Progress Notes (Signed)
Interdry applied under the skin folds of the abdomen.

## 2019-06-09 NOTE — Plan of Care (Signed)
Nurse spoke with the patient's son and updated him on the patient's current status and plan of care. IV fluids continued. Orthostatic vitals signs obtained as the patient was dizzy when getting out of bed with PT. BP meds on hold  as the patient vitals signs are still not within order parameters when getting out of bed. The patient is up in the chair. UA to be obtained. ACHS.  Problem: Education: Goal: Knowledge of General Education information will improve Description: Including pain rating scale, medication(s)/side effects and non-pharmacologic comfort measures Outcome: Progressing   Problem: Health Behavior/Discharge Planning: Goal: Ability to manage health-related needs will improve Outcome: Progressing   Problem: Clinical Measurements: Goal: Ability to maintain clinical measurements within normal limits will improve Outcome: Progressing Goal: Will remain free from infection Outcome: Progressing Goal: Diagnostic test results will improve Outcome: Progressing Goal: Respiratory complications will improve Outcome: Progressing Goal: Cardiovascular complication will be avoided Outcome: Progressing   Problem: Activity: Goal: Risk for activity intolerance will decrease Outcome: Progressing   Problem: Nutrition: Goal: Adequate nutrition will be maintained Outcome: Progressing   Problem: Coping: Goal: Level of anxiety will decrease Outcome: Progressing   Problem: Elimination: Goal: Will not experience complications related to bowel motility Outcome: Progressing Goal: Will not experience complications related to urinary retention Outcome: Progressing   Problem: Pain Managment: Goal: General experience of comfort will improve Outcome: Progressing   Problem: Safety: Goal: Ability to remain free from injury will improve Outcome: Progressing   Problem: Skin Integrity: Goal: Risk for impaired skin integrity will decrease Outcome: Progressing

## 2019-06-09 NOTE — Progress Notes (Signed)
MD notified: The patient's orthostatic vital signs were obtained as he was dizzy getting out of bed with PT. Vitals lying BP 110/51, HR 67; sitting 98/53, HR 63; and Standing 81/58 HR 71. I held his metoprolol this morning since his orthostatic vitals were still off.

## 2019-06-10 LAB — HEMOGLOBIN A1C
Hgb A1c MFr Bld: 8.1 % — ABNORMAL HIGH (ref 4.8–5.6)
Mean Plasma Glucose: 185.77 mg/dL

## 2019-06-10 LAB — GLUCOSE, CAPILLARY
Glucose-Capillary: 65 mg/dL — ABNORMAL LOW (ref 70–99)
Glucose-Capillary: 59 mg/dL — ABNORMAL LOW (ref 70–99)
Glucose-Capillary: 69 mg/dL — ABNORMAL LOW (ref 70–99)
Glucose-Capillary: 89 mg/dL (ref 70–99)
Glucose-Capillary: 85 mg/dL (ref 70–99)
Glucose-Capillary: 89 mg/dL (ref 70–99)
Glucose-Capillary: 85 mg/dL (ref 70–99)

## 2019-06-10 LAB — BASIC METABOLIC PANEL
Anion gap: 4 — ABNORMAL LOW (ref 5–15)
BUN: 41 mg/dL — ABNORMAL HIGH (ref 8–23)
CO2: 22 mmol/L (ref 22–32)
Calcium: 8.7 mg/dL — ABNORMAL LOW (ref 8.9–10.3)
Chloride: 116 mmol/L — ABNORMAL HIGH (ref 98–111)
Creatinine, Ser: 2.1 mg/dL — ABNORMAL HIGH (ref 0.61–1.24)
GFR calc Af Amer: 32 mL/min — ABNORMAL LOW (ref >60)
GFR calc non Af Amer: 28 mL/min — ABNORMAL LOW (ref >60)
Glucose, Bld: 46 mg/dL — ABNORMAL LOW (ref 70–99)
Potassium: 4.6 mmol/L (ref 3.5–5.1)
Sodium: 142 mmol/L (ref 135–145)

## 2019-06-10 LAB — URINE CULTURE: Culture: NO GROWTH

## 2019-06-10 LAB — MAGNESIUM: Magnesium: 2.2 mg/dL (ref 1.7–2.4)

## 2019-06-10 MED ORDER — FLUCONAZOLE 100 MG PO TABS
100.0000 mg | ORAL_TABLET | Freq: Once | ORAL | Status: AC
  Administered 2019-06-10: 100 mg via ORAL
  Filled 2019-06-10: qty 1

## 2019-06-10 MED ORDER — GUAIFENESIN-DM 100-10 MG/5ML PO SYRP
5.0000 mL | ORAL_SOLUTION | ORAL | Status: DC | PRN
  Administered 2019-06-10 (×2): 5 mL via ORAL
  Filled 2019-06-10 (×2): qty 5

## 2019-06-10 MED ORDER — INSULIN GLARGINE 100 UNIT/ML ~~LOC~~ SOLN
18.0000 [IU] | Freq: Every day | SUBCUTANEOUS | Status: DC
  Filled 2019-06-10: qty 0.18

## 2019-06-10 NOTE — Progress Notes (Signed)
Patient has congestion and wheezing this morning. PRN robitussin ordered. Monitoring low blood glucose levels.

## 2019-06-10 NOTE — TOC Progression Note (Signed)
Transition of Care [facility]) - Progression Note    Patient Details  Name: Steven Phelps MRN: [id number] Date of Birth: 1931-04-24  Transition of Care [facility]) CM/SW Contact  Shela Leff, [place] Phone Number: 06/10/2019, 2:23 PM  Clinical Narrative:   CSW has had to leave a message for patient's documented guardian: Steven Phelps: [phone] who is also patient's son. CSW has spoken to [employer] at [facility] and they can take patient back at discharge and stated to get an order for home health PT and she will arrange it.     Expected Discharge Plan: Assisted Living Barriers to Discharge: No Barriers Identified  Expected Discharge Plan and Services Expected Discharge Plan: Assisted Living       Living arrangements for the past 2 months: Assisted Living Facility                                       Social Determinants of Health (SDOH) Interventions    Readmission Risk Interventions No flowsheet data found.

## 2019-06-10 NOTE — Progress Notes (Signed)
MD notified: Orthostatic vital signs obtained  lying 119/52, HR 59; sitting 111/61, HR 56; standing 0 minutes BP 111/53, HR 65, Standing for 3 minutes BP 94/80, HR 63

## 2019-06-10 NOTE — Progress Notes (Signed)
[facility] [place] NAME: Steven Phelps    MR#:  [id number]  DATE OF BIRTH:  [birth year]  SUBJECTIVE:  CHIEF COMPLAINT:   Chief Complaint  Patient presents with  . Fall  . Hypotension   No new complaint this morning.  Resting comfortably.  Patient with decreased hearing at baseline.  REVIEW OF SYSTEMS:  Review of Systems  Constitutional: Negative for chills and fever.  HENT: Positive for hearing loss. Negative for tinnitus.   Eyes: Negative for blurred vision and double vision.  Respiratory: Negative for shortness of breath.   Cardiovascular: Negative for chest pain and palpitations.  Gastrointestinal: Negative for heartburn and nausea.  Genitourinary: Negative for dysuria.  Musculoskeletal: Negative for back pain and myalgias.  Skin: Negative for itching and rash.  Neurological: Negative for dizziness and headaches.  Psychiatric/Behavioral: Negative for depression and hallucinations.    DRUG ALLERGIES:   Allergies  Allergen Reactions  . Synvisc [Hylan G-F 20] Other (See Comments)    "severe pain in knee"  . Zocor [Simvastatin] Other (See Comments)    "muscle aches in joints"   VITALS:  Blood pressure 94/80, pulse 63, temperature 98 F (36.7 C), temperature source Oral, resp. rate 18, height 5\' 8"  (1.727 m), weight 87.7 kg, SpO2 97 %. PHYSICAL EXAMINATION:  Physical Exam  GENERAL:  85 y.o.-year-old patient lying in the bed with no acute distress.  EYES: Pupils equal, round, reactive to light and accommodation. No scleral icterus. Extraocular muscles intact.  HEENT: Significantly decreased hearing.  Dry oral mucosa.    NECK:  Supple, no jugular venous distention. No thyroid enlargement, no tenderness.  LUNGS: Normal breath sounds bilaterally, no wheezing, rales,rhonchi or crepitation. No use of accessory muscles of respiration.  CARDIOVASCULAR: S1, S2 normal. No murmurs, rubs, or gallops.  ABDOMEN: Soft, nontender,  nondistended. Bowel sounds present. No organomegaly or mass.  EXTREMITIES: Slight laceration around the left elbow joint following fall.  No pedal edema, cyanosis, or clubbing.  NEUROLOGIC: Cranial nerves II through XII are intact. Muscle strength 4/5 in all extremities. Sensation intact. Gait not checked.  PSYCHIATRIC: The patient is alert and oriented x 2.  SKIN: Dry skin.  No obvious rash, lesion, or ulcer.   LABORATORY PANEL:  Male CBC Recent Labs  Lab 06/09/19 0415  WBC 6.5  HGB 10.2*  HCT 33.1*  PLT 233   ------------------------------------------------------------------------------------------------------------------ Chemistries  Recent Labs  Lab 06/08/19 1115  06/10/19 0426  NA  --    < > 142  K  --    < > 4.6  CL  --    < > 116*  CO2  --    < > 22  GLUCOSE  --    < > 46*  BUN  --    < > 41*  CREATININE  --    < > 2.10*  CALCIUM  --    < > 8.7*  MG  --    < > 2.2  AST 30  --   --   ALT 21  --   --   ALKPHOS 36*  --   --   BILITOT 0.5  --   --    < > = values in this interval not displayed.   RADIOLOGY:  No results found. ASSESSMENT AND PLAN:   Patient is an 85 year old male with history of chronic atrial fibrillation, hypertension, diabetes mellitus, glaucoma, hyperlipidemia, decreased hearing, hypothyroidism and recent treatment for urinary tract infection with  antibiotics with Bactrim who was brought into the emergency room today from nursing home following fall.  Diagnosed with acute kidney injury.   1.  Acute kidney injury Most likely prerenal from dehydration.  Decreased p.o. intake.  Noted creatinine of 3.59 on admission.  Last creatinine was 1.04 on 05/2018. Renal function gradually improving with IV fluids with creatinine down to 3.17 > 2.1. Renal ultrasound done with no hydronephrosis.  Avoiding nephrotoxic agents.  Discontinued Bactrim which patient was taken for recent UTI prior to this admission.  This can cause elevation in creatinine.  Holding  off on losartan and Lasix as well.  Follow-up on renal function in a.m.   Follow-up on nephrology input .  2.  Fall at nursing home.  Mechanical No evidence of any injury.  No neurological deficit.  No loss of consciousness reported. Physical therapy to evaluate and treat for recommendation.  3.  Recent urinary tract infection Patient was being treated with Bactrim prior to admission.  Repeat urinalysis negative. Holding off on Bactrim due to acute kidney injury.   4.  Hypothyroidism Continue Synthroid.  TSH level normal  5.  Diabetes mellitus type 2 Continue long-acting insulin.  Placed on sliding scale insulin coverage.  Glycosylated hemoglobin level 8.1. Patient's blood glucose level was running low this morning so decrease the dose of Lantus.  6.  Hypertension Blood pressure stable.    Norvasc was previously placed on hold due to blood pressure being borderline.   Metoprolol held this morning due to orthostatic hypotension  7.  Decreased hearing Primary care physician to refer to ENT as outpatient.  8.  Proximal atrial fibrillation Rate controlled with metoprolol but this was held this morning due to orthostatic hypotension. Anticoagulation contraindicated due to recurrent falls.  DVT prophylaxis; heparin   All the records are reviewed and case discussed with Care Management/Social Worker. Management plans discussed with the patient, family and they are in agreement.  CODE STATUS: DNR  TOTAL TIME TAKING CARE OF THIS PATIENT: 32 minutes.   More than 50% of the time was spent in counseling/coordination of care: YES  POSSIBLE D/C IN 2 DAYS, DEPENDING ON CLINICAL CONDITION.   Vaughan Basta M.D on 06/10/2019 at 4:02 PM  Between 7am to 6pm - Pager - [phone]  After 6pm go to [email or web address] - Proofreader  [facility]  Office  [phone]  CC: Primary care physician; Juluis Pitch, MD  Note: This dictation was  prepared with Dragon dictation along with smaller phrase technology. Any transcriptional errors that result from this process are unintentional.

## 2019-06-10 NOTE — Progress Notes (Signed)
[facility]  ROUNDING NOTE   Subjective:   Hard of hearing.   UOP 1000 NS at 168mL/hr  Objective:  Vital signs in last 24 hours:  Temp:  [98 F (36.7 C)-98.5 F (36.9 C)] 98 F (36.7 C) (07/06 0329) Pulse Rate:  [56-72] 63 (07/06 1056) Resp:  [17-20] 18 (07/06 0329) BP: (94-121)/(52-80) 94/80 (07/06 1056) SpO2:  [97 %] 97 % (07/06 1200) Weight:  [87.7 kg] 87.7 kg (07/06 0329)  Weight change:  Filed Weights   06/10/19 0329  Weight: 87.7 kg    Intake/Output: I/O last 3 completed shifts: In: 4037.5 [P.O.:600; I.V.:3437.5] Out: 1000 [Urine:1000]   Intake/Output this shift:  Total I/O In: 240 [P.O.:240] Out: 650 [Urine:650]  Physical Exam: General: NAD,   Head: Hard of hearing. Dry oral mucosal membranes  Eyes: Anicteric, PERRL  Neck: Supple, trachea midline  Lungs:  Clear to auscultation  Heart: Regular rate and rhythm  Abdomen:  Soft, nontender,   Extremities: no peripheral edema.  Neurologic: Nonfocal, moving all four extremities  Skin: No lesions       Basic Metabolic Panel: Recent Labs  Lab 06/08/19 1102 06/09/19 0415 06/10/19 0426  NA 139 141 142  K 4.7 4.9 4.6  CL 107 114* 116*  CO2 23 23 22   GLUCOSE 119* 90 46*  BUN 61* 54* 41*  CREATININE 3.59* 3.17* 2.10*  CALCIUM 8.6* 8.5* 8.7*  MG 2.4 2.3 2.2  PHOS  --  3.5  --     Liver Function Tests: Recent Labs  Lab 06/08/19 1115  AST 30  ALT 21  ALKPHOS 36*  BILITOT 0.5  PROT 6.0*  ALBUMIN 3.2*   Recent Labs  Lab 06/08/19 1115  LIPASE 28   No results for input(s): AMMONIA in the last 168 hours.  CBC: Recent Labs  Lab 06/08/19 1102 06/09/19 0415  WBC 6.6 6.5  NEUTROABS 4.5  --   HGB 10.7* 10.2*  HCT 34.2* 33.1*  MCV 93.7 96.2  PLT 251 233    Cardiac Enzymes: No results for input(s): CKTOTAL, CKMB, CKMBINDEX, TROPONINI in the last 168 hours.  BNP: Invalid input(s): POCBNP  CBG: Recent Labs  Lab 06/10/19 0621 06/10/19 0644 06/10/19 0658  06/10/19 0738 06/10/19 1155  GLUCAP 65* 59* 69* 89 85    Microbiology: Results for orders placed or performed during the hospital encounter of 06/08/19  SARS Coronavirus 2 (CEPHEID - Performed in [place] hospital lab), Hosp Order     Status: None   Collection Time: 06/08/19 12:03 PM   Specimen: Nasopharyngeal Swab  Result Value Ref Range Status   SARS Coronavirus 2 NEGATIVE NEGATIVE Final    Comment: (NOTE) If result is NEGATIVE SARS-CoV-2 target nucleic acids are NOT DETECTED. The SARS-CoV-2 RNA is generally detectable in upper and lower  respiratory specimens during the acute phase of infection. The lowest  concentration of SARS-CoV-2 viral copies this assay can detect is 250  copies / mL. A negative result does not preclude SARS-CoV-2 infection  and should not be used as the sole basis for treatment or other  patient management decisions.  A negative result may occur with  improper specimen collection / handling, submission of specimen other  than nasopharyngeal swab, presence of viral mutation(s) within the  areas targeted by this assay, and inadequate number of viral copies  (<250 copies / mL). A negative result must be combined with clinical  observations, patient history, and epidemiological information. If result is POSITIVE SARS-CoV-2 target nucleic acids are  DETECTED. The SARS-CoV-2 RNA is generally detectable in upper and lower  respiratory specimens dur ing the acute phase of infection.  Positive  results are indicative of active infection with SARS-CoV-2.  Clinical  correlation with patient history and other diagnostic information is  necessary to determine patient infection status.  Positive results do  not rule out bacterial infection or co-infection with other viruses. If result is PRESUMPTIVE POSTIVE SARS-CoV-2 nucleic acids MAY BE PRESENT.   A presumptive positive result was obtained on the submitted specimen  and confirmed on repeat testing.  While 2019  novel coronavirus  (SARS-CoV-2) nucleic acids may be present in the submitted sample  additional confirmatory testing may be necessary for epidemiological  and / or clinical management purposes  to differentiate between  SARS-CoV-2 and other Sarbecovirus currently known to infect humans.  If clinically indicated additional testing with an alternate test  methodology [phone]) is advised. The SARS-CoV-2 RNA is generally  detectable in upper and lower respiratory sp ecimens during the acute  phase of infection. The expected result is Negative. Fact Sheet for Patients:  StrictlyIdeas.no Fact Sheet for Healthcare Providers: BankingDealers.co.za This test is not yet approved or cleared by the [place] FDA and has been authorized for detection and/or diagnosis of SARS-CoV-2 by FDA under an Emergency Use Authorization (EUA).  This EUA will remain in effect (meaning this test can be used) for the duration of the COVID-19 declaration under Section 564(b)(1) of the Act, 21 U.S.C. section 360bbb-3(b)(1), unless the authorization is terminated or revoked sooner. Performed at [facility], [address] [place], [place] [address]   MRSA PCR Screening     Status: None   Collection Time: 06/09/19  5:30 AM   Specimen: Nasopharyngeal  Result Value Ref Range Status   MRSA by PCR NEGATIVE NEGATIVE Final    Comment:        The GeneXpert MRSA Assay (FDA approved for NASAL specimens only), is one component of a comprehensive MRSA colonization surveillance program. It is not intended to diagnose MRSA infection nor to guide or monitor treatment for MRSA infections. Performed at [facility], [address], [place], [place] [address]   Urine Culture     Status: None   Collection Time: 06/09/19  3:54 PM   Specimen: Urine, Clean Catch  Result Value Ref Range Status   Specimen Description   Final    URINE, CLEAN  CATCH Performed at [facility], [address], [place], [place] [address]    Special Requests   Final    NONE Performed at [facility], [address], [place], [place] [address]    Culture   Final    NO GROWTH Performed at [facility], [place] [address] [place], [place] [address]    Report Status 06/10/2019 FINAL  Final    Coagulation Studies: No results for input(s): LABPROT, INR in the last 72 hours.  Urinalysis: Recent Labs    06/09/19 0530  COLORURINE YELLOW*  LABSPEC 1.013  PHURINE 5.0  GLUCOSEU NEGATIVE  HGBUR NEGATIVE  BILIRUBINUR NEGATIVE  KETONESUR NEGATIVE  PROTEINUR NEGATIVE  NITRITE NEGATIVE  LEUKOCYTESUR TRACE*      Imaging: No results found.   Medications:   . sodium chloride 100 mL/hr at 06/10/19 1207   . aspirin EC  81 mg Oral Daily  . atorvastatin  40 mg Oral q1800  . fenofibrate  54 mg Oral Daily  . heparin  5,000 Units Subcutaneous Q8H  .  insulin aspart  0-5 Units Subcutaneous QHS  . insulin aspart  0-9 Units Subcutaneous TID WC  . [START ON 06/11/2019] insulin glargine  18 Units Subcutaneous Daily  . latanoprost  1 drop Both Eyes QHS  . levothyroxine  25 mcg Oral QAC breakfast  . metoprolol tartrate  100 mg Oral BID   guaiFENesin-dextromethorphan  Assessment/ Plan:  Mr. Steven Phelps is a 85 y.o. white male with osteoarthritis, atrial fibrillation, diabetes mellitus type 2, glaucoma, hyperlipidemia, hypertension, chronic kidney disease stage III baseline creatinine 1.4, who was admitted to [facility] on 06/08/2019 for evaluation of fall and hypotension.  Patient resides at [facility].   1.  Acute renal failure on chronic kidney disease stage III baseline creatinine 1.4, GFR of 48 on 12/21/2018.  Creatinine improving.  - Holding furosemide - Continue IV fluids  2.  Hypotension:  holding losartan, amlodipine, and furosemide.  3.  Anemia of chronic kidney disease.  Hemoglobin 10.2.  Iron studies  reviewed. Pending SPEP/UPEP     LOS: 2 Kyria Bumgardner 7/6/20204:32 PM

## 2019-06-10 NOTE — Consult Note (Signed)
[facility] Nurse wound consult note Reason for Consult: Diffuse presentation of fungal overgrowth in the axillae, subpannicular area, bilateral inguinal, periscrotal and buttocks at gluteal cleft (intertriginous dermatitis with fungal overgrowth).  Wound type: Moisture with fungal overgrowth Pressure Injury POA: NA Measurement: diffuse presentation Wound bed: deep red with satellite lesions present at axillae and medial thighs Drainage (amount, consistency, odor) none Periwound:As described above Dressing procedure/placement/frequency: We will provide a mattress replacement with low air loss feature to mitigate microclimate. The topical antifungal cream preparation has been discontinued in favor of our house antimicrobial, moisture-wicking textile (InterDry). I have communicated with Dr. Anselm Jungling to recommend a systemic antifungal in a short course to enhance eradication (eg., Diflucan).   [place] nursing team will not follow, but will remain available to this patient, the nursing and medical teams.  Please re-consult if needed. Thanks, Maudie Flakes, MSN, RN, [place], Arther Abbott  Pager# [phone]

## 2019-06-11 LAB — GLUCOSE, CAPILLARY
Glucose-Capillary: 174 mg/dL — ABNORMAL HIGH (ref 70–99)
Glucose-Capillary: 30 mg/dL — CL (ref 70–99)
Glucose-Capillary: 87 mg/dL (ref 70–99)
Glucose-Capillary: 62 mg/dL — ABNORMAL LOW (ref 70–99)
Glucose-Capillary: 71 mg/dL (ref 70–99)
Glucose-Capillary: 78 mg/dL (ref 70–99)
Glucose-Capillary: 92 mg/dL (ref 70–99)
Glucose-Capillary: 104 mg/dL — ABNORMAL HIGH (ref 70–99)
Glucose-Capillary: 136 mg/dL — ABNORMAL HIGH (ref 70–99)
Glucose-Capillary: 137 mg/dL — ABNORMAL HIGH (ref 70–99)
Glucose-Capillary: 122 mg/dL — ABNORMAL HIGH (ref 70–99)

## 2019-06-11 LAB — PROTEIN ELECTROPHORESIS, SERUM
A/G Ratio: 1.3 (ref 0.7–1.7)
Albumin ELP: 3.2 g/dL (ref 2.9–4.4)
Alpha-1-Globulin: 0.2 g/dL (ref 0.0–0.4)
Alpha-2-Globulin: 0.7 g/dL (ref 0.4–1.0)
Beta Globulin: 0.8 g/dL (ref 0.7–1.3)
Gamma Globulin: 0.8 g/dL (ref 0.4–1.8)
Globulin, Total: 2.4 g/dL (ref 2.2–3.9)
Total Protein ELP: 5.6 g/dL — ABNORMAL LOW (ref 6.0–8.5)

## 2019-06-11 LAB — PROTEIN ELECTRO, RANDOM URINE
Albumin ELP, Urine: 30.3 %
Alpha-1-Globulin, U: 4.7 %
Alpha-2-Globulin, U: 18.2 %
Beta Globulin, U: 29.8 %
Gamma Globulin, U: 17.1 %
Total Protein, Urine: 5.2 mg/dL

## 2019-06-11 LAB — BASIC METABOLIC PANEL
Anion gap: 6 (ref 5–15)
BUN: 29 mg/dL — ABNORMAL HIGH (ref 8–23)
CO2: 21 mmol/L — ABNORMAL LOW (ref 22–32)
Calcium: 8.7 mg/dL — ABNORMAL LOW (ref 8.9–10.3)
Chloride: 113 mmol/L — ABNORMAL HIGH (ref 98–111)
Creatinine, Ser: 1.71 mg/dL — ABNORMAL HIGH (ref 0.61–1.24)
GFR calc Af Amer: 41 mL/min — ABNORMAL LOW (ref >60)
GFR calc non Af Amer: 36 mL/min — ABNORMAL LOW (ref >60)
Glucose, Bld: 86 mg/dL (ref 70–99)
Potassium: 5 mmol/L (ref 3.5–5.1)
Sodium: 140 mmol/L (ref 135–145)

## 2019-06-11 MED ORDER — METOPROLOL TARTRATE 25 MG PO TABS
25.0000 mg | ORAL_TABLET | Freq: Two times a day (BID) | ORAL | Status: DC
  Administered 2019-06-11: 22:00:00 25 mg via ORAL
  Filled 2019-06-11 (×2): qty 1

## 2019-06-11 MED ORDER — DEXTROSE 50 % IV SOLN
INTRAVENOUS | Status: AC
  Filled 2019-06-11: qty 50

## 2019-06-11 MED ORDER — DEXTROSE-NACL 5-0.45 % IV SOLN
INTRAVENOUS | Status: DC
  Administered 2019-06-11 – 2019-06-12 (×2): via INTRAVENOUS

## 2019-06-11 MED ORDER — DEXTROSE 50 % IV SOLN
50.0000 mL | Freq: Once | INTRAVENOUS | Status: AC
  Administered 2019-06-11: 50 mL via INTRAVENOUS

## 2019-06-11 NOTE — Progress Notes (Addendum)
Hypoglycemic Event  CBG: 62  Treatment: 4oz of regular soda given with breakfast  Symptoms: Asymptomatic  Follow-up CBG: [id number]  CBG Result: 71  Possible Reasons for Event: Lantus dosage?   Comments/MD notified: Attending MD notifed   Fuller Mandril, RN

## 2019-06-11 NOTE — Progress Notes (Signed)
[facility]  ROUNDING NOTE   Subjective:   Hard of hearing.   Creatinine 1.7 ( 2.1) NS at 162mL/hr  Objective:  Vital signs in last 24 hours:  Temp:  [97.3 F (36.3 C)-97.8 F (36.6 C)] 97.8 F (36.6 C) (07/07 1152) Pulse Rate:  [49-75] 75 (07/07 1152) Resp:  [18-20] 18 (07/07 1152) BP: (104-126)/(49-76) 124/76 (07/07 1152) SpO2:  [94 %-100 %] 98 % (07/07 1152)  Weight change:  Filed Weights   06/10/19 0329  Weight: 87.7 kg    Intake/Output: I/O last 3 completed shifts: In: 3646.4 [P.O.:360; I.V.:3286.4] Out: 1575 [VZCHY:8502]   Intake/Output this shift:  Total I/O In: 240 [P.O.:240] Out: -   Physical Exam: General: NAD,   Head: Hard of hearing. Dry oral mucosal membranes  Eyes: Anicteric, PERRL  Neck: Supple, trachea midline  Lungs:  Clear to auscultation  Heart: Regular rate and rhythm  Abdomen:  Soft, nontender,   Extremities: no peripheral edema.  Neurologic: Nonfocal, moving all four extremities  Skin: No lesions       Basic Metabolic Panel: Recent Labs  Lab 06/08/19 1102 06/09/19 0415 06/10/19 0426 06/11/19 1009  NA 139 141 142 140  K 4.7 4.9 4.6 5.0  CL 107 114* 116* 113*  CO2 23 23 22  21*  GLUCOSE 119* 90 46* 86  BUN 61* 54* 41* 29*  CREATININE 3.59* 3.17* 2.10* 1.71*  CALCIUM 8.6* 8.5* 8.7* 8.7*  MG 2.4 2.3 2.2  --   PHOS  --  3.5  --   --     Liver Function Tests: Recent Labs  Lab 06/08/19 1115  AST 30  ALT 21  ALKPHOS 36*  BILITOT 0.5  PROT 6.0*  ALBUMIN 3.2*   Recent Labs  Lab 06/08/19 1115  LIPASE 28   No results for input(s): AMMONIA in the last 168 hours.  CBC: Recent Labs  Lab 06/08/19 1102 06/09/19 0415  WBC 6.6 6.5  NEUTROABS 4.5  --   HGB 10.7* 10.2*  HCT 34.2* 33.1*  MCV 93.7 96.2  PLT 251 233    Cardiac Enzymes: No results for input(s): CKTOTAL, CKMB, CKMBINDEX, TROPONINI in the last 168 hours.  BNP: Invalid input(s): POCBNP  CBG: Recent Labs  Lab 06/11/19 0752  06/11/19 0851 06/11/19 0903 06/11/19 1032 06/11/19 1151  GLUCAP 62* 71 78 92 104*    Microbiology: Results for orders placed or performed during the hospital encounter of 06/08/19  SARS Coronavirus 2 (CEPHEID - Performed in [place] hospital lab), Hosp Order     Status: None   Collection Time: 06/08/19 12:03 PM   Specimen: Nasopharyngeal Swab  Result Value Ref Range Status   SARS Coronavirus 2 NEGATIVE NEGATIVE Final    Comment: (NOTE) If result is NEGATIVE SARS-CoV-2 target nucleic acids are NOT DETECTED. The SARS-CoV-2 RNA is generally detectable in upper and lower  respiratory specimens during the acute phase of infection. The lowest  concentration of SARS-CoV-2 viral copies this assay can detect is 250  copies / mL. A negative result does not preclude SARS-CoV-2 infection  and should not be used as the sole basis for treatment or other  patient management decisions.  A negative result may occur with  improper specimen collection / handling, submission of specimen other  than nasopharyngeal swab, presence of viral mutation(s) within the  areas targeted by this assay, and inadequate number of viral copies  (<250 copies / mL). A negative result must be combined with clinical  observations, patient history, and  epidemiological information. If result is POSITIVE SARS-CoV-2 target nucleic acids are DETECTED. The SARS-CoV-2 RNA is generally detectable in upper and lower  respiratory specimens dur ing the acute phase of infection.  Positive  results are indicative of active infection with SARS-CoV-2.  Clinical  correlation with patient history and other diagnostic information is  necessary to determine patient infection status.  Positive results do  not rule out bacterial infection or co-infection with other viruses. If result is PRESUMPTIVE POSTIVE SARS-CoV-2 nucleic acids MAY BE PRESENT.   A presumptive positive result was obtained on the submitted specimen  and confirmed on  repeat testing.  While 2019 novel coronavirus  (SARS-CoV-2) nucleic acids may be present in the submitted sample  additional confirmatory testing may be necessary for epidemiological  and / or clinical management purposes  to differentiate between  SARS-CoV-2 and other Sarbecovirus currently known to infect humans.  If clinically indicated additional testing with an alternate test  methodology [phone]) is advised. The SARS-CoV-2 RNA is generally  detectable in upper and lower respiratory sp ecimens during the acute  phase of infection. The expected result is Negative. Fact Sheet for Patients:  StrictlyIdeas.no Fact Sheet for Healthcare Providers: BankingDealers.co.za This test is not yet approved or cleared by the [place] FDA and has been authorized for detection and/or diagnosis of SARS-CoV-2 by FDA under an Emergency Use Authorization (EUA).  This EUA will remain in effect (meaning this test can be used) for the duration of the COVID-19 declaration under Section 564(b)(1) of the Act, 21 U.S.C. section 360bbb-3(b)(1), unless the authorization is terminated or revoked sooner. Performed at [facility], [address] [place], [place] [address]   MRSA PCR Screening     Status: None   Collection Time: 06/09/19  5:30 AM   Specimen: Nasopharyngeal  Result Value Ref Range Status   MRSA by PCR NEGATIVE NEGATIVE Final    Comment:        The GeneXpert MRSA Assay (FDA approved for NASAL specimens only), is one component of a comprehensive MRSA colonization surveillance program. It is not intended to diagnose MRSA infection nor to guide or monitor treatment for MRSA infections. Performed at [facility], [address] [place], [place] [address]   Urine Culture     Status: None   Collection Time: 06/09/19  3:54 PM   Specimen: Urine, Clean Catch  Result Value Ref Range Status   Specimen Description   Final     URINE, CLEAN CATCH Performed at [facility], [address] [place], [place] [address]    Special Requests   Final    NONE Performed at [facility], [address], [place], [place] [address]    Culture   Final    NO GROWTH Performed at [facility], [place] [address] [place], [place] [address]    Report Status 06/10/2019 FINAL  Final    Coagulation Studies: No results for input(s): LABPROT, INR in the last 72 hours.  Urinalysis: Recent Labs    06/09/19 0530  COLORURINE YELLOW*  LABSPEC 1.013  PHURINE 5.0  GLUCOSEU NEGATIVE  HGBUR NEGATIVE  BILIRUBINUR NEGATIVE  KETONESUR NEGATIVE  PROTEINUR NEGATIVE  NITRITE NEGATIVE  LEUKOCYTESUR TRACE*      Imaging: No results found.   Medications:   . dextrose 5 % and 0.45% NaCl 75 mL/hr at 06/11/19 1156   . aspirin EC  81 mg Oral Daily  . atorvastatin  40 mg Oral q1800  . fenofibrate  54 mg Oral Daily  . heparin  5,000 Units Subcutaneous Q8H  . insulin aspart  0-5 Units Subcutaneous QHS  . insulin aspart  0-9 Units Subcutaneous TID WC  . latanoprost  1 drop Both Eyes QHS  . levothyroxine  25 mcg Oral QAC breakfast  . metoprolol tartrate  100 mg Oral BID   guaiFENesin-dextromethorphan  Assessment/ Plan:  Mr. Steven Phelps is a 85 y.o. white male with osteoarthritis, atrial fibrillation, diabetes mellitus type 2, glaucoma, hyperlipidemia, hypertension, chronic kidney disease stage III baseline creatinine 1.4, who was admitted to [facility] on 06/08/2019 for evaluation of fall and hypotension.  Patient resides at [facility].   1.  Acute renal failure on chronic kidney disease stage III baseline creatinine 1.4, GFR of 48 on 12/21/2018.  Creatinine improving.  - Holding furosemide - Continue IV fluids - change to D51/2NS infusion due to hypoglycemia.   2.  Hypotension:  holding losartan, amlodipine, and furosemide. Blood pressure at goal Continue metoprolol.   3.  Anemia of chronic kidney  disease.  Hemoglobin 10.2.  Iron studies reviewed. Pending SPEP/UPEP     LOS: 3 Steven Phelps 7/7/20201:07 PM

## 2019-06-11 NOTE — Progress Notes (Addendum)
I got in report that patient's blood sugar was 46 on 06-10-19 @ 0426 by lab. I had the NT check patient's blood sugar this am @ 2:35 and it was 29. We gave 50 ml of D50 and some sips of juice. Patient's reck blood sugar was 174. Patient never lost consciousness and was able to answer questions. I paged hospitalist and Dr Sidney Ace returned my page and said to hold Lantus dose today @ 1000. Order held.

## 2019-06-11 NOTE — Progress Notes (Signed)
PT Cancellation Note  Patient Details Name: Steven Phelps MRN: [id number] DOB: 09/06/1931   Cancelled Treatment:    Reason Eval/Treat Not Completed: Other (comment).  Nurse cleared pt for participation in physical therapy.  Pt very HOH so therapist utilized white board for communication.  Pt agreeable to getting OOB to walk.  Therapist set up pt's room to clear path for walking but then pt's lunch tray came and pt requesting to eat instead.  Pt notified that therapist may not be able to return today and pt voicing his understanding and requesting to be set up for lunch.  Pt set up for lunch and room re-set up.  Nurse notified.  Will re-attempt PT treatment session at a later date/time.  Leitha Bleak, PT 06/11/19, 12:45 PM [phone]

## 2019-06-11 NOTE — Progress Notes (Signed)
[place] at [place] NAME: Steven Phelps    MR#:  [id number]  DATE OF BIRTH:  1930/12/26  SUBJECTIVE:  CHIEF COMPLAINT:   Chief Complaint  Patient presents with  . Fall  . Hypotension   No new complaint this morning.  Resting comfortably.  Patient with decreased hearing at baseline.  Had hypoglycemia last night.  REVIEW OF SYSTEMS:  Review of Systems  Constitutional: Negative for chills and fever.  HENT: Positive for hearing loss. Negative for tinnitus.   Eyes: Negative for blurred vision and double vision.  Respiratory: Negative for shortness of breath.   Cardiovascular: Negative for chest pain and palpitations.  Gastrointestinal: Negative for heartburn and nausea.  Genitourinary: Negative for dysuria.  Musculoskeletal: Negative for back pain and myalgias.  Skin: Negative for itching and rash.  Neurological: Negative for dizziness and headaches.  Psychiatric/Behavioral: Negative for depression and hallucinations.    DRUG ALLERGIES:   Allergies  Allergen Reactions  . Synvisc [Hylan G-F 20] Other (See Comments)    "severe pain in knee"  . Zocor [Simvastatin] Other (See Comments)    "muscle aches in joints"   VITALS:  Blood pressure 124/76, pulse 75, temperature 97.8 F (36.6 C), temperature source Oral, resp. rate 18, height 5\' 8"  (1.727 m), weight 87.7 kg, SpO2 98 %. PHYSICAL EXAMINATION:  Physical Exam  GENERAL:  85 y.o.-year-old patient lying in the bed with no acute distress.  EYES: Pupils equal, round, reactive to light and accommodation. No scleral icterus. Extraocular muscles intact.  HEENT: Significantly decreased hearing.  Dry oral mucosa.    NECK:  Supple, no jugular venous distention. No thyroid enlargement, no tenderness.  LUNGS: Normal breath sounds bilaterally, no wheezing, rales,rhonchi or crepitation. No use of accessory muscles of respiration.  CARDIOVASCULAR: S1, S2 normal. No murmurs, rubs, or gallops.   ABDOMEN: Soft, nontender, nondistended. Bowel sounds present. No organomegaly or mass.  EXTREMITIES: Slight laceration around the left elbow joint following fall.  No pedal edema, cyanosis, or clubbing.  NEUROLOGIC: Cranial nerves II through XII are intact. Muscle strength 4/5 in all extremities. Sensation intact. Gait not checked.  PSYCHIATRIC: The patient is alert and oriented x 2.  SKIN: Dry skin.  No obvious rash, lesion, or ulcer.   LABORATORY PANEL:  Male CBC Recent Labs  Lab 06/09/19 0415  WBC 6.5  HGB 10.2*  HCT 33.1*  PLT 233   ------------------------------------------------------------------------------------------------------------------ Chemistries  Recent Labs  Lab 06/08/19 1115  06/10/19 0426 06/11/19 1009  NA  --    < > 142 140  K  --    < > 4.6 5.0  CL  --    < > 116* 113*  CO2  --    < > 22 21*  GLUCOSE  --    < > 46* 86  BUN  --    < > 41* 29*  CREATININE  --    < > 2.10* 1.71*  CALCIUM  --    < > 8.7* 8.7*  MG  --    < > 2.2  --   AST 30  --   --   --   ALT 21  --   --   --   ALKPHOS 36*  --   --   --   BILITOT 0.5  --   --   --    < > = values in this interval not displayed.   RADIOLOGY:  No results found. ASSESSMENT AND  PLAN:   Patient is an 85 year old male with history of chronic atrial fibrillation, hypertension, diabetes mellitus, glaucoma, hyperlipidemia, decreased hearing, hypothyroidism and recent treatment for urinary tract infection with antibiotics with Bactrim who was brought into the emergency room today from nursing home following fall.  Diagnosed with acute kidney injury.   1.  Acute kidney injury Most likely prerenal from dehydration.  Decreased p.o. intake.  Noted creatinine of 3.59 on admission.  Last creatinine was 1.04 on 05/2018. Renal function gradually improving with IV fluids with creatinine down to 3.17 > 2.1> 1.7. Renal ultrasound done with no hydronephrosis.  Avoiding nephrotoxic agents.  Discontinued Bactrim which  patient was taken for recent UTI prior to this admission.  This can cause elevation in creatinine.  Holding off on losartan and Lasix as well.  Follow-up on renal function in a.m.   Follow-up on nephrology input .  2.  Fall at nursing home.  Mechanical No evidence of any injury.  No neurological deficit.  No loss of consciousness reported. Physical therapy to evaluate and treat for recommendation. His facility is ready to take him back whenever he is medically ready.  3.  Recent urinary tract infection Patient was being treated with Bactrim prior to admission.  Repeat urinalysis negative. Holding off on Bactrim due to acute kidney injury.   4.  Hypothyroidism Continue Synthroid.  TSH level normal  5.  Diabetes mellitus type 2-hypoglycemia Continue long-acting insulin.  Placed on sliding scale insulin coverage.  Glycosylated hemoglobin level 8.1. Patient's blood glucose level was running low  so decrease the dose of Lantus. His blood sugar was still critically low, stopped Lantus and he is currently on dextrose IV drip.  6.  Hypertension Blood pressure stable.    Norvasc was previously placed on hold due to blood pressure being borderline.   Metoprolol held due to orthostatic hypotension, now blood pressure is stable and resume with lower dose.  7.  Decreased hearing Primary care physician to refer to ENT as outpatient.  8.  Proximal atrial fibrillation Rate controlled with metoprolol but this was held this morning due to orthostatic hypotension. Anticoagulation contraindicated due to recurrent falls.  DVT prophylaxis; heparin   All the records are reviewed and case discussed with Care Management/Social Worker. Management plans discussed with the patient, family and they are in agreement.  CODE STATUS: DNR  TOTAL TIME TAKING CARE OF THIS PATIENT: 32 minutes.   More than 50% of the time was spent in counseling/coordination of care: YES  POSSIBLE D/C IN 1-2 DAYS,  DEPENDING ON CLINICAL CONDITION. I spoke to patient's son on the phone for possible discharge tomorrow.  Vaughan Basta M.D on 06/11/2019 at 2:28 PM  Between 7am to 6pm - Pager - [phone]  After 6pm go to [email or web address] - Proofreader  [facility]  Office  [phone]  CC: Primary care physician; Juluis Pitch, MD  Note: This dictation was prepared with Dragon dictation along with smaller phrase technology. Any transcriptional errors that result from this process are unintentional.

## 2019-06-11 NOTE — Progress Notes (Signed)
Physical Therapy Treatment Patient Details Name: Steven Phelps MRN: [id number] DOB: 1931-12-03 Today's Date: 06/11/2019    History of Present Illness Steven Phelps is an 85yoM [facility] after a fall at [facility] associated with dizziness. Pt found to have AKI and dehydration. PMH: AF, AVR, Lt TKA, HOH. Pt was here 65month ago for a fall, but reports no other falls since; Baseline amb with SPC.    PT Comments    Pt presents with deficits in strength, transfers, mobility, gait, balance, and activity tolerance but is slowly progressing towards goals.  Pt motivated and actively participated throughout the session but is very [facility] and required frequent verbal and tactile cues to follow commands along with occasional written communication to answer questions.  Pt was mod A with bed mobility tasks and min A with transfers with cues provided for proper sequencing.  Pt was asymptomatic during transfer training and after ambulating 30' with a RW and CGA.  At the end of the pt's second 30' walk he c/o dizziness and was returned to sitting with BP 119/76.  Pt's dizziness quickly resolved upon sitting, nursing notified.  Pt will benefit from HHPT services upon discharge to safely address above deficits for decreased caregiver assistance and eventual return to PLOF.     Follow Up Recommendations  Home health PT;Supervision for mobility/OOB     Equipment Recommendations  Rolling walker with 5" wheels    Recommendations for Other Services       Precautions / Restrictions Precautions Precautions: Fall Restrictions Weight Bearing Restrictions: No    Mobility  Bed Mobility Overal bed mobility: Needs Assistance Bed Mobility: Supine to Sit     Supine to sit: Mod assist     General bed mobility comments: Limited by chronic R shoulder injury per pt  Transfers Overall transfer level: Needs assistance Equipment used: Rolling walker (2 wheeled) Transfers: Sit to/from Stand Sit to  Stand: Min assist         General transfer comment: No dizziness during multiple sit to/from stands; min A and min verbal/tactile cues for hand placement  Ambulation/Gait Ambulation/Gait assistance: Min guard;+2 safety/equipment Gait Distance (Feet): 30 Feet Assistive device: Rolling walker (2 wheeled) Gait Pattern/deviations: Step-through pattern;Decreased step length - right;Decreased step length - left;Trunk flexed Gait velocity: decreased   General Gait Details: Pt ambulated 2 x 30' with a RW and CGA with +2 follow for safety with no c/o dizziness until right at the end of the second bout of ambulation; pt returned quickly to sitting with BP taken at 119/76 mmHg, nursing notified   Stairs             Wheelchair Mobility    Modified Rankin (Stroke Patients Only)       Balance Overall balance assessment: Mild deficits observed, not formally tested;Modified Independent;History of Falls                                          Cognition Arousal/Alertness: Awake/alert Behavior During Therapy: WFL for tasks assessed/performed Overall Cognitive Status: History of cognitive impairments - at baseline                                        Exercises Total Joint Exercises Ankle Circles/Pumps: AROM;Both;10 reps Heel Slides: AROM;Both;10 reps Hip  ABduction/ADduction: AROM;Both;5 reps Long Arc Quad: AROM;Both;10 reps;Strengthening Knee Flexion: AROM;Strengthening;Both;10 reps Other Exercises Other Exercises: Multiple sit to/from stand transfer training    General Comments        Pertinent Vitals/Pain Pain Assessment: No/denies pain    Home Living                      Prior Function            PT Goals (current goals can now be found in the care plan section) Progress towards PT goals: Progressing toward goals    Frequency    Min 2X/week      PT Plan Current plan remains appropriate    Co-evaluation               AM-PAC PT "6 Clicks" Mobility   Outcome Measure  Help needed turning from your back to your side while in a flat bed without using bedrails?: A Lot Help needed moving from lying on your back to sitting on the side of a flat bed without using bedrails?: A Lot Help needed moving to and from a bed to a chair (including a wheelchair)?: A Little Help needed standing up from a chair using your arms (e.g., wheelchair or bedside chair)?: A Little Help needed to walk in hospital room?: A Little Help needed climbing 3-5 steps with a railing? : A Little 6 Click Score: 16    End of Session Equipment Utilized During Treatment: Gait belt Activity Tolerance: Patient tolerated treatment well;Other (comment)(Mild dizziness at the end of the session that resolved quickly upon sitting) Patient left: in chair;with call bell/phone within reach;with chair alarm set Nurse Communication: Mobility status;Other (comment)(Pt c/o dizziness at end of amb) PT Visit Diagnosis: Unsteadiness on feet (R26.81);History of falling (Z91.81);Other symptoms and signs involving the nervous system (R29.898);Difficulty in walking, not elsewhere classified (R26.2);Dizziness and giddiness (R42)     Time: 1435-1500 PT Time Calculation (min) (ACUTE ONLY): 25 min  Charges:  $Gait Training: 8-22 mins $Therapeutic Activity: 8-22 mins                     D. Scott Leonarda Leis PT, DPT 06/11/19, 3:16 PM

## 2019-06-11 NOTE — Care Management Important Message (Signed)
Important Message  Patient Details  Name: Steven Phelps MRN: [id number] Date of Birth: June 13, 1931   Medicare Important Message Given:  Yes     Dannette Barbara 06/11/2019, 11:34 AM

## 2019-06-12 LAB — RENAL FUNCTION PANEL
Albumin: 3 g/dL — ABNORMAL LOW (ref 3.5–5.0)
Anion gap: 6 (ref 5–15)
BUN: 27 mg/dL — ABNORMAL HIGH (ref 8–23)
CO2: 22 mmol/L (ref 22–32)
Calcium: 8.6 mg/dL — ABNORMAL LOW (ref 8.9–10.3)
Chloride: 114 mmol/L — ABNORMAL HIGH (ref 98–111)
Creatinine, Ser: 1.61 mg/dL — ABNORMAL HIGH (ref 0.61–1.24)
GFR calc Af Amer: 45 mL/min — ABNORMAL LOW (ref >60)
GFR calc non Af Amer: 38 mL/min — ABNORMAL LOW (ref >60)
Glucose, Bld: 216 mg/dL — ABNORMAL HIGH (ref 70–99)
Phosphorus: 2.7 mg/dL (ref 2.5–4.6)
Potassium: 5 mmol/L (ref 3.5–5.1)
Sodium: 142 mmol/L (ref 135–145)

## 2019-06-12 LAB — GLUCOSE, CAPILLARY
Glucose-Capillary: 203 mg/dL — ABNORMAL HIGH (ref 70–99)
Glucose-Capillary: 169 mg/dL — ABNORMAL HIGH (ref 70–99)
Glucose-Capillary: 161 mg/dL — ABNORMAL HIGH (ref 70–99)
Glucose-Capillary: 123 mg/dL — ABNORMAL HIGH (ref 70–99)

## 2019-06-12 LAB — CBC
HCT: 32.4 % — ABNORMAL LOW (ref 39.0–52.0)
Hemoglobin: 10.1 g/dL — ABNORMAL LOW (ref 13.0–17.0)
MCH: 29.5 pg (ref 26.0–34.0)
MCHC: 31.2 g/dL (ref 30.0–36.0)
MCV: 94.7 fL (ref 80.0–100.0)
Platelets: 220 10*3/uL (ref 150–400)
RBC: 3.42 MIL/uL — ABNORMAL LOW (ref 4.22–5.81)
RDW: 14.5 % (ref 11.5–15.5)
WBC: 4.4 10*3/uL (ref 4.0–10.5)
nRBC: 0 % (ref 0.0–0.2)

## 2019-06-12 LAB — SARS CORONAVIRUS 2 BY RT PCR (HOSPITAL ORDER, PERFORMED IN ~~LOC~~ HOSPITAL LAB): SARS Coronavirus 2: NEGATIVE

## 2019-06-12 MED ORDER — INSULIN GLARGINE 100 UNIT/ML ~~LOC~~ SOLN: 5.0000 [IU] | Freq: Every day | SUBCUTANEOUS | 11 refills | Status: DC

## 2019-06-12 MED ORDER — INSULIN GLARGINE 100 UNIT/ML ~~LOC~~ SOLN: 5.0000 [IU] | Freq: Every day | SUBCUTANEOUS | 11 refills | Status: AC

## 2019-06-12 MED ORDER — SODIUM ZIRCONIUM CYCLOSILICATE 10 G PO PACK
10.0000 g | PACK | Freq: Once | ORAL | Status: AC
  Administered 2019-06-12: 12:00:00 10 g via ORAL
  Filled 2019-06-12: qty 1

## 2019-06-12 MED ORDER — METOPROLOL TARTRATE 25 MG PO TABS: 25.0000 mg | ORAL_TABLET | Freq: Two times a day (BID) | ORAL | 0 refills | Status: AC

## 2019-06-12 MED ORDER — METOPROLOL TARTRATE 25 MG PO TABS: 25.0000 mg | ORAL_TABLET | Freq: Two times a day (BID) | ORAL | 0 refills | Status: DC

## 2019-06-12 MED ORDER — INSULIN GLARGINE 100 UNIT/ML ~~LOC~~ SOLN
5.0000 [IU] | Freq: Every day | SUBCUTANEOUS | Status: DC
  Administered 2019-06-12: 5 [IU] via SUBCUTANEOUS
  Filled 2019-06-12 (×2): qty 0.05

## 2019-06-12 NOTE — Discharge Summary (Signed)
[facility] NAME: Steven Phelps    MR#:  [id number]  DATE OF BIRTH:  Jul 04, 1931  DATE OF ADMISSION:  06/08/2019 ADMITTING PHYSICIAN: Otila Back, MD  DATE OF DISCHARGE: 06/12/2019   PRIMARY CARE PHYSICIAN: Juluis Pitch, MD    ADMISSION DIAGNOSIS:  Dehydration [E86.0] AKI (acute kidney injury) ([place]) [N17.9] Multiple skin tears [T14.8XXA] Fall, initial encounter [id number]  DISCHARGE DIAGNOSIS:  Active Problems:   AKI (acute kidney injury) ([place])   SECONDARY DIAGNOSIS:   Past Medical History:  Diagnosis Date  . Arthritis   . Atrial fibrillation ([place])   . Cancer ([place])    skin  . Diabetes mellitus without complication ([place])   . Glaucoma (increased eye pressure)   . Heart murmur   . HLD (hyperlipidemia)   . Hypertension   . Shortness of breath dyspnea     HOSPITAL COURSE:   Patient is an 85 year old male with history of chronic atrial fibrillation,hypertension,diabetes mellitus,glaucoma,hyperlipidemia,decreased hearing,hypothyroidism and recent treatment for urinary tract infection with antibiotics with Bactrim who was brought into the emergency room today from nursing home following fall. Diagnosed with acute kidney injury.   1.Acute kidney injury Most likely prerenal from dehydration. Decreased p.o. intake. Noted creatinine of 3.59 on admission. Last creatinine was 1.04 on 05/2018. Renal function gradually improving with IV fluids with creatinine down to 3.17 > 2.1> 1.7. Renal ultrasound done with no hydronephrosis.  Avoiding nephrotoxic agents. Discontinued Bactrim which patient was taken for recent UTI prior to this admission. This can cause elevation in creatinine. Holding off on losartan and Lasix as well. Follow-up on renal function in a.m.  Follow-up on nephrology input .  Advise to check renal func in 1 week.  2.Fall at nursing home. Mechanical No evidence of any injury. No  neurological deficit. No loss of consciousness reported. Physical therapy to evaluate and treat for recommendation. His facility is ready to take him back whenever he is medically ready.  3.Recent urinary tract infection Patient was being treated with Bactrim prior to admission. Repeat urinalysis negative. Holding off on Bactrim due to acute kidney injury.   4.Hypothyroidism Continue Synthroid. TSH level normal  5.Diabetes mellitus type 2-hypoglycemia Continue long-acting insulin. Placed on sliding scale insulin coverage. Glycosylated hemoglobin level 8.1. Patient's blood glucose level was running low  so decrease the dose of Lantus. His blood sugar was still critically low, stopped Lantus and he is currently on dextrose IV drip.  Blood sugar stable - just started on only 5 U lantus on discharge.  6.Hypertension Blood pressure stable.   Norvasc was previously placed on hold due to blood pressure being borderline.   Metoprolol held due to orthostatic hypotension, now blood pressure is stable and resume with lower dose.  7.Decreased hearing Primary care physician to refer to ENT as outpatient.  8.Proximal atrial fibrillation Rate controlled with metoprolol but this was held this morning due to orthostatic hypotension. Anticoagulation contraindicated due to recurrent falls.  DVT prophylaxis;heparin   DISCHARGE CONDITIONS:   Stable.  CONSULTS OBTAINED:    DRUG ALLERGIES:   Allergies  Allergen Reactions  . Synvisc [Hylan G-F 20] Other (See Comments)    "severe pain in knee"  . Zocor [Simvastatin] Other (See Comments)    "muscle aches in joints"    DISCHARGE MEDICATIONS:   Allergies as of 06/12/2019      Reactions   Synvisc [hylan G-f 20] Other (See Comments)   "severe pain in knee"   Zocor [simvastatin]  Other (See Comments)   "muscle aches in joints"      Medication List    STOP taking these medications   amLODipine 5 MG  tablet Commonly known as: NORVASC   amoxicillin 500 MG capsule Commonly known as: AMOXIL   insulin glargine 100 unit/mL Sopn Commonly known as: LANTUS Replaced by: insulin glargine 100 UNIT/ML injection   losartan 50 MG tablet Commonly known as: COZAAR   sulfamethoxazole-trimethoprim 800-160 MG tablet Commonly known as: BACTRIM DS     TAKE these medications   aspirin 81 MG tablet Take 1 tablet (81 mg total) by mouth daily.   atorvastatin 40 MG tablet Commonly known as: LIPITOR Take 40 mg by mouth daily at 6 PM.   clotrimazole 1 % cream Commonly known as: LOTRIMIN Apply 1 application topically 2 (two) times daily. apply to cracks of buttocks and armpit   fenofibrate 145 MG tablet Commonly known as: TRICOR Take 145 mg by mouth daily.   furosemide 20 MG tablet Commonly known as: LASIX Take 1 tablet (20 mg total) by mouth daily.   insulin glargine 100 UNIT/ML injection Commonly known as: LANTUS Inject 0.05 mLs (5 Units total) into the skin daily. Start taking on: June 13, 2019 Replaces: insulin glargine 100 unit/mL Sopn   latanoprost 0.005 % ophthalmic solution Commonly known as: XALATAN Place 1 drop into both eyes at bedtime.   levothyroxine 25 MCG tablet Commonly known as: SYNTHROID Take 25 mcg by mouth daily before breakfast.   metoprolol tartrate 25 MG tablet Commonly known as: LOPRESSOR Take 1 tablet (25 mg total) by mouth 2 (two) times daily. What changed:   medication strength  how much to take        DISCHARGE INSTRUCTIONS:    Follow with PMD in 1-2 weeks. Check renal func, blood pressure  and Blood glucose as changes made in medications.  If you experience worsening of your admission symptoms, develop shortness of breath, life threatening emergency, suicidal or homicidal thoughts you must seek medical attention immediately by calling 911 or calling your MD immediately  if symptoms less severe.  You Must read complete instructions/literature  along with all the possible adverse reactions/side effects for all the Medicines you take and that have been prescribed to you. Take any new Medicines after you have completely understood and accept all the possible adverse reactions/side effects.   Please note  You were cared for by a hospitalist during your hospital stay. If you have any questions about your discharge medications or the care you received while you were in the hospital after you are discharged, you can call the unit and asked to speak with the hospitalist on call if the hospitalist that took care of you is not available. Once you are discharged, your primary care physician will handle any further medical issues. Please note that NO REFILLS for any discharge medications will be authorized once you are discharged, as it is imperative that you return to your primary care physician (or establish a relationship with a primary care physician if you do not have one) for your aftercare needs so that they can reassess your need for medications and monitor your lab values.    Today   CHIEF COMPLAINT:   Chief Complaint  Patient presents with  . Fall  . Hypotension    HISTORY OF PRESENT ILLNESS:  Steven Phelps  is a 85 y.o. male with a known history of chronic atrial fibrillation, hypertension, diabetes mellitus, glaucoma, hyperlipidemia, decreased hearing, hypothyroidism and recent  treatment for urinary tract infection with antibiotics with Bactrim who was brought into the emergency room today from nursing home following a fall.  No report of loss of consciousness.  Patient sustained some mild bruise with skin tear on upper extremity.  No chest pain.  No shortness of breath.  Patient was evaluated emergency room and CT scan of the head done without contrast was negative for any acute findings.  Laboratory studies done revealed evidence of acute kidney injury.  Patient appears dehydrated clinically.  Renal ultrasound done with no  hydronephrosis.  Medical service called to admit patient for further evaluation and management.   VITAL SIGNS:  Blood pressure 114/71, pulse 61, temperature 98.3 F (36.8 C), temperature source Oral, resp. rate 15, height 5\' 8"  (1.727 m), weight 87.7 kg, SpO2 96 %.  I/O:    Intake/Output Summary (Last 24 hours) at 06/12/2019 1326 Last data filed at 06/12/2019 1216 Gross per 24 hour  Intake 2275.98 ml  Output 1450 ml  Net 825.98 ml    PHYSICAL EXAMINATION:   GENERAL:85 y.o.-year-old patient lying in the bed with no acute distress.  EYES: Pupils equal, round, reactive to light and accommodation. No scleral icterus. Extraocular muscles intact.  HEENT:Significantly decreased hearing. Dry oral mucosa.  NECK: Supple, no jugular venous distention. No thyroid enlargement, no tenderness.  LUNGS: Normal breath sounds bilaterally, no wheezing, rales,rhonchi or crepitation. No use of accessory muscles of respiration.  CARDIOVASCULAR: S1, S2 normal. No murmurs, rubs, or gallops.  ABDOMEN: Soft, nontender, nondistended. Bowel sounds present. No organomegaly or mass.  EXTREMITIES:Slight laceration around the left elbow joint following fall. No pedal edema, cyanosis, or clubbing.  NEUROLOGIC: Cranial nerves II through XII are intact. Muscle strength 4/5 in all extremities. Sensation intact. Gait not checked.  PSYCHIATRIC: The patient is alert and oriented x 2.  SKIN:Dry skin. No obvious rash, lesion, or ulcer.   DATA REVIEW:   CBC Recent Labs  Lab 06/12/19 0448  WBC 4.4  HGB 10.1*  HCT 32.4*  PLT 220    Chemistries  Recent Labs  Lab 06/08/19 1115  06/10/19 0426  06/12/19 0448  NA  --    < > 142   < > 142  K  --    < > 4.6   < > 5.0  CL  --    < > 116*   < > 114*  CO2  --    < > 22   < > 22  GLUCOSE  --    < > 46*   < > 216*  BUN  --    < > 41*   < > 27*  CREATININE  --    < > 2.10*   < > 1.61*  CALCIUM  --    < > 8.7*   < > 8.6*  MG  --    < > 2.2  --   --   AST 30   --   --   --   --   ALT 21  --   --   --   --   ALKPHOS 36*  --   --   --   --   BILITOT 0.5  --   --   --   --    < > = values in this interval not displayed.    Cardiac Enzymes No results for input(s): TROPONINI in the last 168 hours.  Microbiology Results  Results for orders placed or performed during the hospital  encounter of 06/08/19  SARS Coronavirus 2 (CEPHEID - Performed in [facility] hospital lab), Hosp Order     Status: None   Collection Time: 06/08/19 12:03 PM   Specimen: Nasopharyngeal Swab  Result Value Ref Range Status   SARS Coronavirus 2 NEGATIVE NEGATIVE Final    Comment: (NOTE) If result is NEGATIVE SARS-CoV-2 target nucleic acids are NOT DETECTED. The SARS-CoV-2 RNA is generally detectable in upper and lower  respiratory specimens during the acute phase of infection. The lowest  concentration of SARS-CoV-2 viral copies this assay can detect is 250  copies / mL. A negative result does not preclude SARS-CoV-2 infection  and should not be used as the sole basis for treatment or other  patient management decisions.  A negative result may occur with  improper specimen collection / handling, submission of specimen other  than nasopharyngeal swab, presence of viral mutation(s) within the  areas targeted by this assay, and inadequate number of viral copies  (<250 copies / mL). A negative result must be combined with clinical  observations, patient history, and epidemiological information. If result is POSITIVE SARS-CoV-2 target nucleic acids are DETECTED. The SARS-CoV-2 RNA is generally detectable in upper and lower  respiratory specimens dur ing the acute phase of infection.  Positive  results are indicative of active infection with SARS-CoV-2.  Clinical  correlation with patient history and other diagnostic information is  necessary to determine patient infection status.  Positive results do  not rule out bacterial infection or co-infection with other viruses. If  result is PRESUMPTIVE POSTIVE SARS-CoV-2 nucleic acids MAY BE PRESENT.   A presumptive positive result was obtained on the submitted specimen  and confirmed on repeat testing.  While 2019 novel coronavirus  (SARS-CoV-2) nucleic acids may be present in the submitted sample  additional confirmatory testing may be necessary for epidemiological  and / or clinical management purposes  to differentiate between  SARS-CoV-2 and other Sarbecovirus currently known to infect humans.  If clinically indicated additional testing with an alternate test  methodology [phone]) is advised. The SARS-CoV-2 RNA is generally  detectable in upper and lower respiratory sp ecimens during the acute  phase of infection. The expected result is Negative. Fact Sheet for Patients:  StrictlyIdeas.no Fact Sheet for Healthcare Providers: BankingDealers.co.za This test is not yet approved or cleared by the [place] FDA and has been authorized for detection and/or diagnosis of SARS-CoV-2 by FDA under an Emergency Use Authorization (EUA).  This EUA will remain in effect (meaning this test can be used) for the duration of the COVID-19 declaration under Section 564(b)(1) of the Act, 21 U.S.C. section 360bbb-3(b)(1), unless the authorization is terminated or revoked sooner. Performed at [facility], [address] [place], [place] [address]   MRSA PCR Screening     Status: None   Collection Time: 06/09/19  5:30 AM   Specimen: Nasopharyngeal  Result Value Ref Range Status   MRSA by PCR NEGATIVE NEGATIVE Final    Comment:        The GeneXpert MRSA Assay (FDA approved for NASAL specimens only), is one component of a comprehensive MRSA colonization surveillance program. It is not intended to diagnose MRSA infection nor to guide or monitor treatment for MRSA infections. Performed at [facility], [address], [place], [place] [address]    Urine Culture     Status: None   Collection Time: 06/09/19  3:54 PM   Specimen: Urine, Clean Catch  Result Value Ref Range Status  Specimen Description   Final    URINE, CLEAN CATCH Performed at [facility], [address], [place], [place] [address]    Special Requests   Final    NONE Performed at [facility], [address], [place], [place] [address]    Culture   Final    NO GROWTH Performed at [facility], [place] [address], [place], [place] [address]    Report Status 06/10/2019 FINAL  Final    RADIOLOGY:  No results found.  EKG:   Orders placed or performed during the hospital encounter of 06/08/19  . ED EKG  . ED EKG  . EKG 12-Lead  . EKG 12-Lead      Management plans discussed with the patient, family and they are in agreement.  CODE STATUS:     Code Status Orders  (From admission, onward)         Start     Ordered   06/08/19 1429  Do not attempt resuscitation (DNR)  Continuous    Question Answer Comment  In the event of cardiac or respiratory ARREST Do not call a "code blue"   In the event of cardiac or respiratory ARREST Do not perform Intubation, CPR, defibrillation or ACLS   In the event of cardiac or respiratory ARREST Use medication by any route, position, wound care, and other measures to relive pain and suffering. May use oxygen, suction and manual treatment of airway obstruction as needed for comfort.   Comments Patient clearly wishes to be DNR/DNI.  Son at bedside agrees with his decision.      06/08/19 1429        Code Status History    Date Active Date Inactive Code Status Order ID Comments User Context   05/12/2018 1321 05/14/2018 1605 Full Code [id number]  Loletha Grayer, MD ED   11/22/2015 1831 11/24/2015 1908 Full Code [id number]  Lance Coon, MD Inpatient   06/03/2015 1241 06/06/2015 1357 Full Code [id number]  Dereck Leep, MD Inpatient   Advance Care Planning Activity      TOTAL TIME TAKING CARE OF  THIS PATIENT: 35 minutes.    Vaughan Basta M.D on 06/12/2019 at 1:26 PM  Between 7am to 6pm - Pager - [phone]  After 6pm go to [email or web address] - password [email or web address] [facility]  Office  [phone]  CC: Primary care physician; Juluis Pitch, MD   Note: This dictation was prepared with Dragon dictation along with smaller phrase technology. Any transcriptional errors that result from this process are unintentional.

## 2019-06-12 NOTE — Progress Notes (Signed)
Rockey Situ to be D/C'd Blenda Peals per MD order.  Discussed prescriptions and follow up appointments with the patient's daughter, Leveda Anna. Prescriptions given to [facility], medication list explained in detail. Leveda Anna verbalized understanding.  Allergies as of 06/12/2019      Reactions   Synvisc [hylan G-f 20] Other (See Comments)   "severe pain in knee"   Zocor [simvastatin] Other (See Comments)   "muscle aches in joints"      Medication List    STOP taking these medications   amLODipine 5 MG tablet Commonly known as: NORVASC   amoxicillin 500 MG capsule Commonly known as: AMOXIL   insulin glargine 100 unit/mL Sopn Commonly known as: LANTUS Replaced by: insulin glargine 100 UNIT/ML injection   losartan 50 MG tablet Commonly known as: COZAAR   sulfamethoxazole-trimethoprim 800-160 MG tablet Commonly known as: BACTRIM DS     TAKE these medications   aspirin 81 MG tablet Take 1 tablet (81 mg total) by mouth daily.   atorvastatin 40 MG tablet Commonly known as: LIPITOR Take 40 mg by mouth daily at 6 PM.   clotrimazole 1 % cream Commonly known as: LOTRIMIN Apply 1 application topically 2 (two) times daily. apply to cracks of buttocks and armpit   fenofibrate 145 MG tablet Commonly known as: TRICOR Take 145 mg by mouth daily.   furosemide 20 MG tablet Commonly known as: LASIX Take 1 tablet (20 mg total) by mouth daily.   insulin glargine 100 UNIT/ML injection Commonly known as: LANTUS Inject 0.05 mLs (5 Units total) into the skin daily. Start taking on: June 13, 2019 Replaces: insulin glargine 100 unit/mL Sopn   latanoprost 0.005 % ophthalmic solution Commonly known as: XALATAN Place 1 drop into both eyes at bedtime.   levothyroxine 25 MCG tablet Commonly known as: SYNTHROID Take 25 mcg by mouth daily before breakfast.   metoprolol tartrate 25 MG tablet Commonly known as: LOPRESSOR Take 1 tablet (25 mg total) by mouth 2 (two) times daily. What changed:    medication strength  how much to take       Vitals:   06/12/19 0922 06/12/19 1157  BP: 106/61 114/71  Pulse: 67 61  Resp:  15  Temp:  98.3 F (36.8 C)  SpO2: 99% 96%    Skin clean, dry and intact without evidence of skin break down, no evidence of skin tears noted. IV catheter discontinued intact. Site without signs and symptoms of complications. Dressing and pressure applied. Pt denies pain at this time. No complaints noted.  An After Visit Summary was printed and given to the patient. Patient escorted via [place], and D/C home via private auto.  Fuller Mandril, RN

## 2019-06-12 NOTE — TOC Progression Note (Signed)
Transition of Care [facility]) - Progression Note    Patient Details  Name: Steven Phelps MRN: [id number] Date of Birth: 1931/02/10  [facility] CM/SW Contact  Shela Leff, [place] Phone Number: 06/12/2019, 3:05 PM  Clinical Narrative:   Physician discharging patient today to return to [facility]. Linda at [facility] made aware and has received the discharge information. Patient's daughter: Steven Phelps: [phone] has been informed by CSW and will provide transportation back to [facility] today. Currently having to wait on Covid test to return.    Expected Discharge Plan: Assisted Living Barriers to Discharge: No Barriers Identified  Expected Discharge Plan and Services Expected Discharge Plan: Assisted Living       Living arrangements for the past 2 months: [place] Expected Discharge Date: 06/12/19                                     Social Determinants of Health (SDOH) Interventions    Readmission Risk Interventions No flowsheet data found.

## 2019-06-12 NOTE — Progress Notes (Signed)
[facility]  ROUNDING NOTE   Subjective:   Hard of hearing.   Creatinine 1.61(1.7) ( 2.1) D5 1/2NS at 29mL  Objective:  Vital signs in last 24 hours:  Temp:  [98.3 F (36.8 C)] 98.3 F (36.8 C) (07/08 1157) Pulse Rate:  [61-70] 61 (07/08 1157) Resp:  [15-20] 15 (07/08 1157) BP: (106-123)/(59-71) 114/71 (07/08 1157) SpO2:  [96 %-99 %] 96 % (07/08 1157)  Weight change:  Filed Weights   06/10/19 0329  Weight: 87.7 kg    Intake/Output: I/O last 3 completed shifts: In: 4534.1 [P.O.:960; I.V.:3574.1] Out: 1250 [Urine:1250]   Intake/Output this shift:  Total I/O In: 120 [P.O.:120] Out: 200 [Urine:200]  Physical Exam: General: NAD, seated in a chair  Head: Hard of hearing. Moist oral mucosal membranes  Eyes: Anicteric, PERRL  Neck: Supple, trachea midline  Lungs:  Clear to auscultation  Heart: Regular rate and rhythm  Abdomen:  Soft, nontender,   Extremities: no peripheral edema.  Neurologic: Nonfocal, moving all four extremities  Skin: No lesions       Basic Metabolic Panel: Recent Labs  Lab 06/08/19 1102 06/09/19 0415 06/10/19 0426 06/11/19 1009 06/12/19 0448  NA 139 141 142 140 142  K 4.7 4.9 4.6 5.0 5.0  CL 107 114* 116* 113* 114*  CO2 23 23 22  21* 22  GLUCOSE 119* 90 46* 86 216*  BUN 61* 54* 41* 29* 27*  CREATININE 3.59* 3.17* 2.10* 1.71* 1.61*  CALCIUM 8.6* 8.5* 8.7* 8.7* 8.6*  MG 2.4 2.3 2.2  --   --   PHOS  --  3.5  --   --  2.7    Liver Function Tests: Recent Labs  Lab 06/08/19 1115 06/12/19 0448  AST 30  --   ALT 21  --   ALKPHOS 36*  --   BILITOT 0.5  --   PROT 6.0*  --   ALBUMIN 3.2* 3.0*   Recent Labs  Lab 06/08/19 1115  LIPASE 28   No results for input(s): AMMONIA in the last 168 hours.  CBC: Recent Labs  Lab 06/08/19 1102 06/09/19 0415 06/12/19 0448  WBC 6.6 6.5 4.4  NEUTROABS 4.5  --   --   HGB 10.7* 10.2* 10.1*  HCT 34.2* 33.1* 32.4*  MCV 93.7 96.2 94.7  PLT 251 233 220    Cardiac  Enzymes: No results for input(s): CKTOTAL, CKMB, CKMBINDEX, TROPONINI in the last 168 hours.  BNP: Invalid input(s): POCBNP  CBG: Recent Labs  Lab 06/11/19 1636 06/11/19 2120 06/12/19 0246 06/12/19 0833 06/12/19 1158  GLUCAP 137* 122* 203* 169* 161*    Microbiology: Results for orders placed or performed during the hospital encounter of 06/08/19  SARS Coronavirus 2 (CEPHEID - Performed in [facility] hospital lab), Hosp Order     Status: None   Collection Time: 06/08/19 12:03 PM   Specimen: Nasopharyngeal Swab  Result Value Ref Range Status   SARS Coronavirus 2 NEGATIVE NEGATIVE Final    Comment: (NOTE) If result is NEGATIVE SARS-CoV-2 target nucleic acids are NOT DETECTED. The SARS-CoV-2 RNA is generally detectable in upper and lower  respiratory specimens during the acute phase of infection. The lowest  concentration of SARS-CoV-2 viral copies this assay can detect is 250  copies / mL. A negative result does not preclude SARS-CoV-2 infection  and should not be used as the sole basis for treatment or other  patient management decisions.  A negative result may occur with  improper specimen collection / handling, submission  of specimen other  than nasopharyngeal swab, presence of viral mutation(s) within the  areas targeted by this assay, and inadequate number of viral copies  (<250 copies / mL). A negative result must be combined with clinical  observations, patient history, and epidemiological information. If result is POSITIVE SARS-CoV-2 target nucleic acids are DETECTED. The SARS-CoV-2 RNA is generally detectable in upper and lower  respiratory specimens dur ing the acute phase of infection.  Positive  results are indicative of active infection with SARS-CoV-2.  Clinical  correlation with patient history and other diagnostic information is  necessary to determine patient infection status.  Positive results do  not rule out bacterial infection or co-infection with  other viruses. If result is PRESUMPTIVE POSTIVE SARS-CoV-2 nucleic acids MAY BE PRESENT.   A presumptive positive result was obtained on the submitted specimen  and confirmed on repeat testing.  While 2019 novel coronavirus  (SARS-CoV-2) nucleic acids may be present in the submitted sample  additional confirmatory testing may be necessary for epidemiological  and / or clinical management purposes  to differentiate between  SARS-CoV-2 and other Sarbecovirus currently known to infect humans.  If clinically indicated additional testing with an alternate test  methodology [phone]) is advised. The SARS-CoV-2 RNA is generally  detectable in upper and lower respiratory sp ecimens during the acute  phase of infection. The expected result is Negative. Fact Sheet for Patients:  StrictlyIdeas.no Fact Sheet for Healthcare Providers: BankingDealers.co.za This test is not yet approved or cleared by the [place] FDA and has been authorized for detection and/or diagnosis of SARS-CoV-2 by FDA under an Emergency Use Authorization (EUA).  This EUA will remain in effect (meaning this test can be used) for the duration of the COVID-19 declaration under Section 564(b)(1) of the Act, 21 U.S.C. section 360bbb-3(b)(1), unless the authorization is terminated or revoked sooner. Performed at [facility], [address] [place], [place] [address]   MRSA PCR Screening     Status: None   Collection Time: 06/09/19  5:30 AM   Specimen: Nasopharyngeal  Result Value Ref Range Status   MRSA by PCR NEGATIVE NEGATIVE Final    Comment:        The GeneXpert MRSA Assay (FDA approved for NASAL specimens only), is one component of a comprehensive MRSA colonization surveillance program. It is not intended to diagnose MRSA infection nor to guide or monitor treatment for MRSA infections. Performed at [facility], [address]  [place], [place] [address]   Urine Culture     Status: None   Collection Time: 06/09/19  3:54 PM   Specimen: Urine, Clean Catch  Result Value Ref Range Status   Specimen Description   Final    URINE, CLEAN CATCH Performed at [facility], [address] [place], [place] [address]    Special Requests   Final    NONE Performed at [facility], [address], [place], [place] [address]    Culture   Final    NO GROWTH Performed at [facility], [place] [address], [place], [place] [address]    Report Status 06/10/2019 FINAL  Final    Coagulation Studies: No results for input(s): LABPROT, INR in the last 72 hours.  Urinalysis: No results for input(s): COLORURINE, LABSPEC, PHURINE, GLUCOSEU, HGBUR, BILIRUBINUR, KETONESUR, PROTEINUR, UROBILINOGEN, NITRITE, LEUKOCYTESUR in the last 72 hours.  Invalid input(s): APPERANCEUR    Imaging: No results found.   Medications:   . dextrose 5 % and  0.45% NaCl 75 mL/hr at 06/12/19 0400   . aspirin EC  81 mg Oral Daily  . atorvastatin  40 mg Oral q1800  . fenofibrate  54 mg Oral Daily  . heparin  5,000 Units Subcutaneous Q8H  . insulin aspart  0-5 Units Subcutaneous QHS  . insulin aspart  0-9 Units Subcutaneous TID WC  . insulin glargine  5 Units Subcutaneous Daily  . latanoprost  1 drop Both Eyes QHS  . levothyroxine  25 mcg Oral QAC breakfast  . metoprolol tartrate  25 mg Oral BID  . sodium zirconium cyclosilicate  10 g Oral Once   guaiFENesin-dextromethorphan  Assessment/ Plan:  Mr. Steven Phelps is a 85 y.o. white male with osteoarthritis, atrial fibrillation, diabetes mellitus type 2, glaucoma, hyperlipidemia, hypertension, chronic kidney disease stage III baseline creatinine 1.4, who was admitted to [facility] on 06/08/2019 for evaluation of fall and hypotension.  Patient resides at [facility]   1.  Acute renal failure on chronic kidney disease stage III baseline creatinine 1.4, GFR of 48 on 12/21/2018.   Creatinine back to baseline. Bland urine.  - Holding furosemide - Continue IV fluids :  D51/2NS infusion due to hypoglycemia.   2.  Hypertension:  holding losartan, amlodipine, and furosemide. Blood pressure at goal Continue metoprolol.   3.  Anemia of chronic kidney disease.  Hemoglobin 10.1.  Iron studies and SPEP/UPEP reviewed.   LOS: 4 Gailya Tauer 7/8/202012:12 PM

## 2019-06-12 NOTE — NC FL2 (Signed)
[place] LEVEL OF CARE SCREENING TOOL     IDENTIFICATION  Patient Name: Steven Phelps Birthdate: 21-Jul-1931 Sex: male Admission Date (Current Location): 06/08/2019  [place] and [place] Number:  Engineering geologist and Address:  [facility], [address] [place], [place] [address]      Provider Number: [id number]  Attending Physician Name and Address:  Vaughan Basta, *  Relative Name and Phone Number:       Current Level of Care: Hospital Recommended Level of Care: [place] Prior Approval Number:    Date Approved/Denied:   PASRR Number:    Discharge Plan: (ALF)    Current Diagnoses: Patient Active Problem List   Diagnosis Date Noted  . AKI (acute kidney injury) ([place]) 06/08/2019  . Acute kidney injury ([place]) 05/12/2018  . Acute respiratory failure with hypoxia ([place]) 11/22/2015  . Type 2 diabetes mellitus ([place]) 11/22/2015  . HTN (hypertension) 11/22/2015  . HLD (hyperlipidemia) 11/22/2015  . A-fib ([place]) 11/22/2015  . CAP (community acquired pneumonia) 11/22/2015  . 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 Total knee replacement status 06/03/2015    Orientation RESPIRATION BLADDER Height & Weight     Self, Place  Normal   Weight: 193 lb 5.5 oz (87.7 kg) Height:  5\' 8"  (172.7 cm)  BEHAVIORAL SYMPTOMS/MOOD NEUROLOGICAL BOWEL NUTRITION STATUS  (none) (none)   Diet(carb modified)  AMBULATORY STATUS COMMUNICATION OF NEEDS Skin   Limited Assist Verbally Normal, Other (Comment)(skin tears)                       Personal Care Assistance Level of Assistance  Bathing, Dressing, Feeding Bathing Assistance: Limited assistance Feeding assistance: Limited assistance Dressing Assistance: Limited assistance     Functional Limitations Info  Hearing   Hearing Info: Impaired      SPECIAL CARE FACTORS FREQUENCY  PT (By licensed PT)                    Contractures Contractures Info: Not present     Additional Factors Info  Code Status               DISCHARGE MEDICATIONS:        Allergies as of 06/12/2019      Reactions   Synvisc [hylan G-f 20] Other (See Comments)   "severe pain in knee"   Zocor [simvastatin] Other (See Comments)   "muscle aches in joints"         Medication List    STOP taking these medications   amLODipine 5 MG tablet Commonly known as: NORVASC   amoxicillin 500 MG capsule Commonly known as: AMOXIL   insulin glargine 100 unit/mL Sopn Commonly known as: LANTUS Replaced by: insulin glargine 100 UNIT/ML injection   losartan 50 MG tablet Commonly known as: COZAAR   sulfamethoxazole-trimethoprim 800-160 MG tablet Commonly known as: BACTRIM DS     TAKE these medications   aspirin 81 MG tablet Take 1 tablet (81 mg total) by mouth daily.   atorvastatin 40 MG tablet Commonly known as: LIPITOR Take 40 mg by mouth daily at 6 PM.   clotrimazole 1 % cream Commonly known as: LOTRIMIN Apply 1 application topically 2 (two) times daily. apply to cracks of buttocks and armpit   fenofibrate 145 MG tablet Commonly known as: TRICOR Take 145 mg by mouth daily.   furosemide 20 MG tablet Commonly known as: LASIX Take 1 tablet (20 mg total) by mouth daily.  insulin glargine 100 UNIT/ML injection Commonly known as: LANTUS Inject 0.05 mLs (5 Units total) into the skin daily. Start taking on: June 13, 2019 Replaces: insulin glargine 100 unit/mL Sopn   latanoprost 0.005 % ophthalmic solution Commonly known as: XALATAN Place 1 drop into both eyes at bedtime.   levothyroxine 25 MCG tablet Commonly known as: SYNTHROID Take 25 mcg by mouth daily before breakfast.   metoprolol tartrate 25 MG tablet Commonly known as: LOPRESSOR Take 1 tablet (25 mg total) by mouth 2 (two) times daily. What changed:   medication strength  how much to take    Additional Weed, LCSW

## 2019-06-14 LAB — GLUCOSE, CAPILLARY: Glucose-Capillary: 29 mg/dL — CL (ref 70–99)

## 2019-07-13 ENCOUNTER — Emergency Department
Admission: EM | Admit: 2019-07-13 | Discharge: 2019-08-06 | Disposition: E | Payer: Medicare Other | Attending: Emergency Medicine | Admitting: Emergency Medicine

## 2019-07-13 DIAGNOSIS — I10 Essential (primary) hypertension: Secondary | ICD-10-CM | POA: Insufficient documentation

## 2019-07-13 DIAGNOSIS — E119 Type 2 diabetes mellitus without complications: Secondary | ICD-10-CM | POA: Insufficient documentation

## 2019-07-13 DIAGNOSIS — Z79899 Other long term (current) drug therapy: Secondary | ICD-10-CM | POA: Diagnosis not present

## 2019-07-13 DIAGNOSIS — Z87891 Personal history of nicotine dependence: Secondary | ICD-10-CM | POA: Diagnosis not present

## 2019-07-13 DIAGNOSIS — Z85828 Personal history of other malignant neoplasm of skin: Secondary | ICD-10-CM | POA: Insufficient documentation

## 2019-07-13 DIAGNOSIS — I469 Cardiac arrest, cause unspecified: Secondary | ICD-10-CM | POA: Diagnosis present

## 2019-07-13 DIAGNOSIS — Z7982 Long term (current) use of aspirin: Secondary | ICD-10-CM | POA: Diagnosis not present

## 2019-07-13 LAB — GLUCOSE, CAPILLARY: Glucose-Capillary: 252 mg/dL — ABNORMAL HIGH (ref 70–99)

## 2019-07-13 MED ORDER — CALCIUM CHLORIDE 10 % IV SOLN
1.0000 g | Freq: Once | INTRAVENOUS | Status: AC
  Administered 2019-07-13: 1 g via INTRAVENOUS

## 2019-07-13 MED ORDER — SODIUM BICARBONATE 8.4 % IV SOLN
50.0000 meq | Freq: Once | INTRAVENOUS | Status: AC
  Administered 2019-07-13: 50 meq via INTRAVENOUS

## 2019-07-13 MED ORDER — EPINEPHRINE PF 1 MG/ML IJ SOLN
1.0000 mg | Freq: Once | INTRAMUSCULAR | Status: AC
  Administered 2019-07-13: 10:00:00 1 mg via INTRAVENOUS

## 2019-07-13 MED ORDER — EPINEPHRINE PF 1 MG/ML IJ SOLN
1.0000 mg | Freq: Once | INTRAMUSCULAR | Status: AC
  Administered 2019-07-13: 1 mg via INTRAVENOUS

## 2019-07-14 MED FILL — Medication: Qty: 1 | Status: AC

## 2019-08-06 NOTE — ED Notes (Signed)
No spontaneous respirations CPR resumed

## 2019-08-06 NOTE — ED Notes (Signed)
Irregular wide rhythm faint femoral pulse

## 2019-08-06 NOTE — ED Notes (Signed)
Epinephrine 1 mg given IO right tibia

## 2019-08-06 NOTE — ED Triage Notes (Signed)
PT arrived via [facility] from [facility] with CPR in progress.

## 2019-08-06 NOTE — ED Notes (Signed)
CPR in progress BG 252

## 2019-08-06 NOTE — ED Notes (Signed)
Pulse check Femoral pulse faint  Wide rhtym 106

## 2019-08-06 NOTE — ED Notes (Signed)
CPR in progress. 

## 2019-08-06 NOTE — ED Notes (Signed)
IV inserted Right AC 22 g

## 2019-08-06 NOTE — ED Notes (Addendum)
Faint femoral pulse Wide agonal rhythm Resume CPR

## 2019-08-06 NOTE — ED Notes (Signed)
swtich provider CPR

## 2019-08-06 NOTE — ED Notes (Signed)
Pronounced by MD

## 2019-08-06 NOTE — ED Notes (Signed)
CPR in progress  Pulse Check

## 2019-08-06 NOTE — ED Notes (Signed)
CPR in progress NS infusing Switch CPR providers

## 2019-08-06 NOTE — ED Notes (Addendum)
MD using [place] to check heart activity

## 2019-08-06 NOTE — ED Notes (Signed)
Tubes not pulled per MD. Pandora Leiter does not wish to visit. Patient shrouded.

## 2019-08-06 NOTE — ED Notes (Signed)
CPR in progress Wide complex heart rhythm on monitor  No respiratory effort

## 2019-08-06 NOTE — ED Notes (Signed)
1 amp bicarb given IO right tibia

## 2019-08-06 NOTE — ED Notes (Addendum)
Intubated at  Size 8 fr 26 at the lip Breath sounds positive Pulse getting faint Resume CPR Epi 1 mg @ 0939 in R AC IV

## 2019-08-06 NOTE — ED Notes (Signed)
Patient from [facility]

## 2019-08-06 NOTE — ED Notes (Signed)
Pulse check none noted, resume CPR

## 2019-08-06 NOTE — ED Notes (Signed)
MD using [place] to check heart activity

## 2019-08-06 NOTE — ED Notes (Signed)
Ventilator setting per RT

## 2019-08-06 NOTE — ED Notes (Signed)
Pulse check femoral none noted No spontaneous respirations No pulse Agonal Rhythm

## 2019-08-06 NOTE — ED Notes (Signed)
CPR discontinued

## 2019-08-06 NOTE — ED Notes (Signed)
Asystole  

## 2019-08-06 NOTE — ED Notes (Signed)
OG placed by MD and secured 60 at the lip

## 2019-08-06 NOTE — ED Notes (Signed)
Suctioning yellow fluid via OG

## 2019-08-06 NOTE — ED Provider Notes (Signed)
[facility] Emergency Department Provider Note ____________________________________________   First MD Initiated Contact with Patient 08/07/19 [phone]     (approximate)  I have reviewed the triage vital signs and the nursing notes.   HISTORY  Chief Complaint No chief complaint on file.  Level 5 caveat: History of present illness limited due to unresponsiveness  HPI Steven Phelps is a 85 y.o. male with PMH as noted below who presents from his facility in cardiac arrest.  Per EMS, the patient had been seen around 8 AM and given his medications.  He was at his baseline at that time.  Subsequently he was found on the ground and was moaning.  He had evidence of an abrasion to his scalp as well as a skin tear to the left arm.  EMS arrived and the patient was found to be in cardiac arrest.  Per EMS he got 3 doses of epinephrine and had a brief ROSC although he lost pulses again prior to ED arrival.  Past Medical History:  Diagnosis Date  . Arthritis   . Atrial fibrillation ([place])   . Cancer ([place])    skin  . Diabetes mellitus without complication ([place])   . Glaucoma (increased eye pressure)   . Heart murmur   . HLD (hyperlipidemia)   . Hypertension   . Shortness of breath dyspnea     Patient Active Problem List   Diagnosis Date Noted  . AKI (acute kidney injury) ([place]) 06/08/2019  . Acute kidney injury ([place]) 05/12/2018  . Acute respiratory failure with hypoxia ([place]) 11/22/2015  . Type 2 diabetes mellitus ([place]) 11/22/2015  . HTN (hypertension) 11/22/2015  . HLD (hyperlipidemia) 11/22/2015  . A-fib ([place]) 11/22/2015  . CAP (community acquired pneumonia) 11/22/2015  . 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 Total knee replacement status 06/03/2015    Past Surgical History:  Procedure Laterality Date  . AORTIC VALVE REPLACEMENT  2010  . CARDIAC CATHETERIZATION    . CHOLECYSTECTOMY    . EYE SURGERY Bilateral    cataract extraction  . TONSILLECTOMY    . TOTAL KNEE  ARTHROPLASTY Left 06/03/2015   Procedure: TOTAL KNEE ARTHROPLASTY;  Surgeon: Dereck Leep, MD;  Location: [facility] ORS;  Service: Orthopedics;  Laterality: Left;    Prior to Admission medications   Medication Sig Start Date End Date Taking? Authorizing Provider  aspirin 81 MG tablet Take 1 tablet (81 mg total) by mouth daily. 06/20/15   Watt Climes, PA  atorvastatin (LIPITOR) 40 MG tablet Take 40 mg by mouth daily at 6 PM.    [provider]  clotrimazole (LOTRIMIN) 1 % cream Apply 1 application topically 2 (two) times daily. apply to cracks of buttocks and armpit    [provider]  fenofibrate (TRICOR) 145 MG tablet Take 145 mg by mouth daily.    [provider]  furosemide (LASIX) 20 MG tablet Take 1 tablet (20 mg total) by mouth daily. 05/15/18   Saundra Shelling, MD  insulin glargine (LANTUS) 100 UNIT/ML injection Inject 0.05 mLs (5 Units total) into the skin daily. 06/13/19   Vaughan Basta, MD  latanoprost (XALATAN) 0.005 % ophthalmic solution Place 1 drop into both eyes at bedtime.    [provider]  levothyroxine (SYNTHROID) 25 MCG tablet Take 25 mcg by mouth daily before breakfast.    [provider]  metoprolol tartrate (LOPRESSOR) 25 MG tablet Take 1 tablet (25 mg total) by mouth 2 (two) times daily. 06/12/19   Anselm Jungling,  Rosalio Macadamia, MD    Allergies Synvisc [hylan g-f 20] and Zocor [simvastatin]  Family History  Problem Relation Age of Onset  . Diabetes Father   . CAD Mother     Social History Social History   Tobacco Use  . Smoking status: Former Smoker    Packs/day: 0.50    Types: Cigarettes  . Smokeless tobacco: Never Used  Substance Use Topics  . Alcohol use: Yes    Comment: 3 drinks a day  . Drug use: No    Review of Systems Level 5 caveat: Unable to obtain review of systems due to unresponsiveness    ____________________________________________   PHYSICAL EXAM:  VITAL SIGNS: ED Triage Vitals  Enc Vitals  Group     BP      Pulse      Resp      Temp      Temp src      SpO2      Weight      Height      Head Circumference      Peak Flow      Pain Score      Pain Loc      Pain Edu?      Excl. in [place]?     Constitutional: Unresponsive. Eyes: Pupils fixed and dilated. Head: Atraumatic anteriorly. Nose: No congestion/rhinnorhea. Mouth/Throat: Mucous membranes are slightly dry.   Neck: No deformity. Cardiovascular: Cardiac arrest, no palpable pulses.   Respiratory: Breath sounds clear bilaterally. Gastrointestinal: Soft and nontender. No distention.  Genitourinary: Normal external genitalia. Musculoskeletal: Extremities cool and pale. Neurologic: Unresponsive; unable to assess. Skin:  No rash noted. Psychiatric: Unable to assess.  ____________________________________________   LABS (all labs ordered are listed, but only abnormal results are displayed)  Labs Reviewed  GLUCOSE, CAPILLARY - Abnormal; Notable for the following components:      Result Value   Glucose-Capillary 252 (*)    All other components within normal limits   ____________________________________________  EKG  ED ECG REPORT I, Arta Silence, the attending physician, personally viewed and interpreted this ECG.  Date: 2019/07/21 EKG Time: 0937 Rate: 85 Rhythm: Nonspecific ectopic rhythm Narrative Interpretation: Nonspecific rhythm  ____________________________________________  RADIOLOGY    ____________________________________________   PROCEDURES  Procedure(s) performed: Yes    Procedure Name: Intubation Date/Time: 07/21/19 10:30 AM Performed by: Arta Silence, MD Pre-anesthesia Checklist: Patient identified, Patient being monitored, Emergency Drugs available, Timeout performed and Suction available Oxygen Delivery Method: Ambu bag Preoxygenation: Pre-oxygenation with 100% oxygen Induction Type: Rapid sequence Laryngoscope Size: Glidescope and 4 Tube size: 8.0 mm Number of  attempts: 1 Placement Confirmation: ETT inserted through vocal cords under direct vision,  CO2 detector and Breath sounds checked- equal and bilateral Secured at: 26 cm Tube secured with: ETT holder       Critical Care performed: Yes  CRITICAL CARE Performed by: Arta Silence   Total critical care time: 30 minutes  Critical care time was exclusive of separately billable procedures and treating other patients.  Critical care was necessary to treat or prevent imminent or life-threatening deterioration.  Critical care was time spent personally by me on the following activities: development of treatment plan with patient and/or surrogate as well as nursing, discussions with consultants, evaluation of patient's response to treatment, examination of patient, obtaining history from patient or surrogate, ordering and performing treatments and interventions, ordering and review of laboratory studies, ordering and review of radiographic studies, pulse oximetry and re-evaluation of patient's condition. ____________________________________________   INITIAL IMPRESSION /  ASSESSMENT AND PLAN / ED COURSE  Pertinent labs & imaging results that were available during my care of the patient were reviewed by me and considered in my medical decision making (see chart for details).  85 year old male with PMH as noted above presents in cardiac arrest after he was found down in his facility.  He was noted to have an abrasion to the back of his head as well as a skin tear to the left arm.  He was seen around 8 AM at his baseline.  On ED arrival, CPR was in progress and the patient had no pulses.  We transferred the patient to our monitor and from the [place] to manual chest compressions.  Per EMS at that time, they had been working on him for approximately 45 minutes and he had only brief ROSC during this time.  We continued ACLS including CPR and epinephrine.  I reviewed the past records and noted that  the patient had a recent admission for renal insufficiency, raising the possibility of hyperkalemia.  Therefore I also gave bicarb and calcium.   After several rounds of CPR the patient had ROSC with somewhat weak pulses.  Bedside ultrasound at that time showed very low ejection fraction.  Prior to being able to start any pressors, the patient once again lost pulses.  We continued resuscitation per ACLS protocol for approximately 25 minutes total.  The patient did not achieve sustained ROSC for more than 30 seconds to 1 minute.  On repeat bedside ultrasound, there was no cardiac activity and the monitor showed asystole.  Given his prolonged cardiac arrest and the lack of sustained response to any of our interventions, there was no indication to continue further resuscitation attempts and time of death was called at 9:56 AM.  I discussed the patient's care with the son, informed him of the patient's death and answered all of his questions.  ____________________________________________   FINAL CLINICAL IMPRESSION(S) / ED DIAGNOSES  Final diagnoses:  None      NEW MEDICATIONS STARTED DURING THIS VISIT:  New Prescriptions   No medications on file     Note:  This document was prepared using Dragon voice recognition software and may include unintentional dictation errors.    Arta Silence, MD 2019/07/24 1034

## 2019-08-06 NOTE — ED Triage Notes (Signed)
No spontaneous respirations upon arrival. Pt being ventilated by EMS.

## 2019-08-06 NOTE — ED Notes (Signed)
Pt transferred to bed [place] continuing

## 2019-08-06 NOTE — ED Notes (Signed)
2929 CPR in progress 0934 pulse check weak radial pulse Pads on

## 2019-08-06 NOTE — ED Notes (Signed)
Swtich providers CPR

## 2019-08-06 NOTE — ED Notes (Signed)
MD using [place] to check hear activity

## 2019-08-06 NOTE — ED Notes (Signed)
Pulse check Faint femoral pulse

## 2019-08-06 NOTE — ED Notes (Signed)
Pt to room via [facility] EMS CPR in progress [place] on Pt being ventilated with Ambu bag IO right tibia NS infusing

## 2019-08-06 DEATH — deceased

## 2020-03-19 IMAGING — US US RENAL
1 series · 14 of 25 positions shown · non-contrast
Comparison: None.

CLINICAL DATA: Patient with elevated creatinine.

EXAM:
RENAL / URINARY TRACT ULTRASOUND COMPLETE

[Series 1: us renal · 14 of 38 slices shown]
[im 1/38]
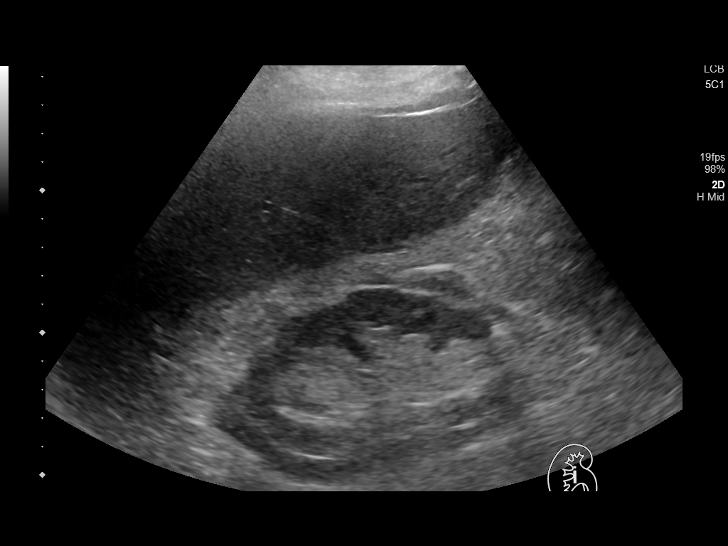
[im 4/38]
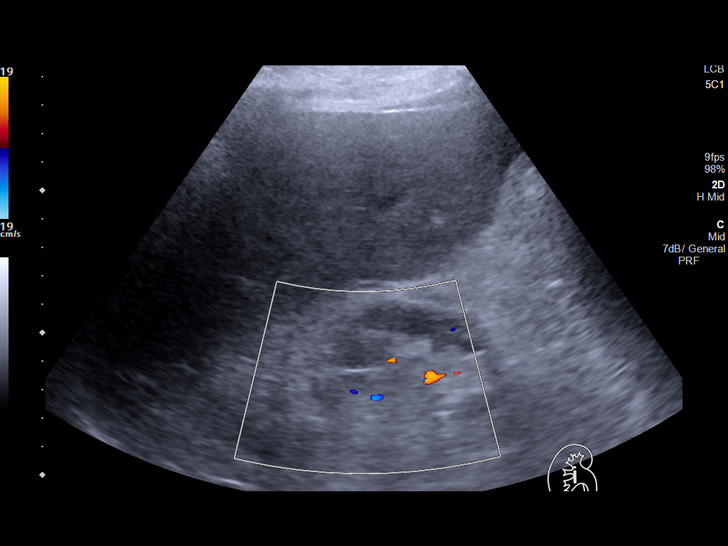
[im 7/38]
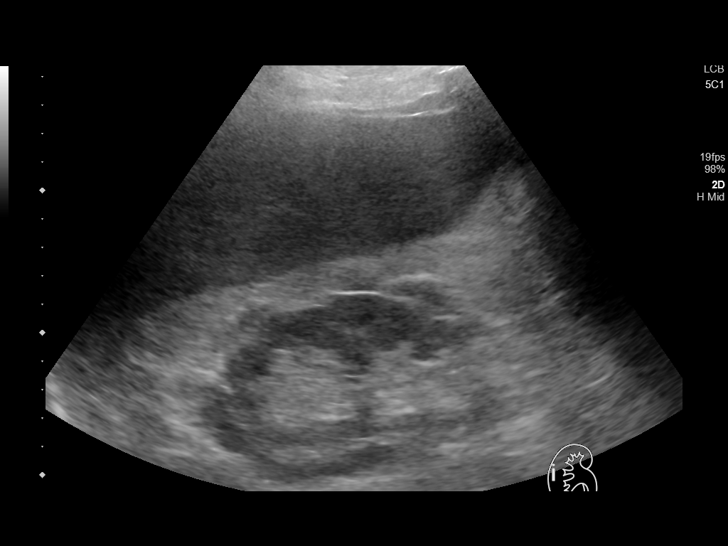
[im 10/38]
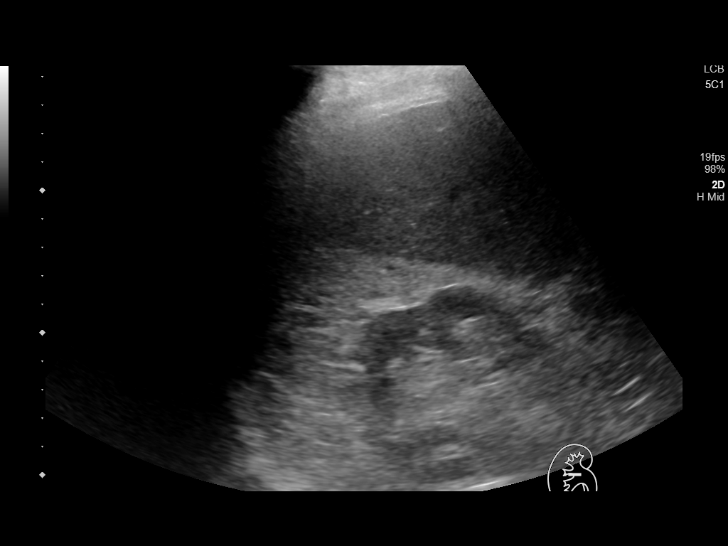
[im 13/38]
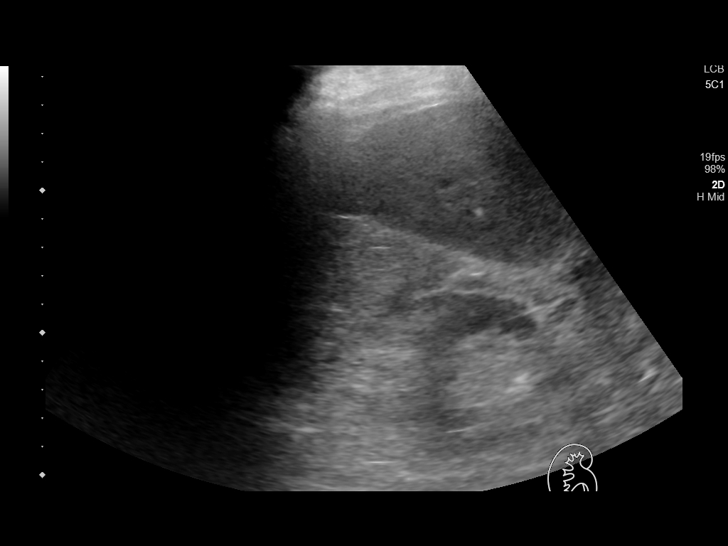
[im 14/38]
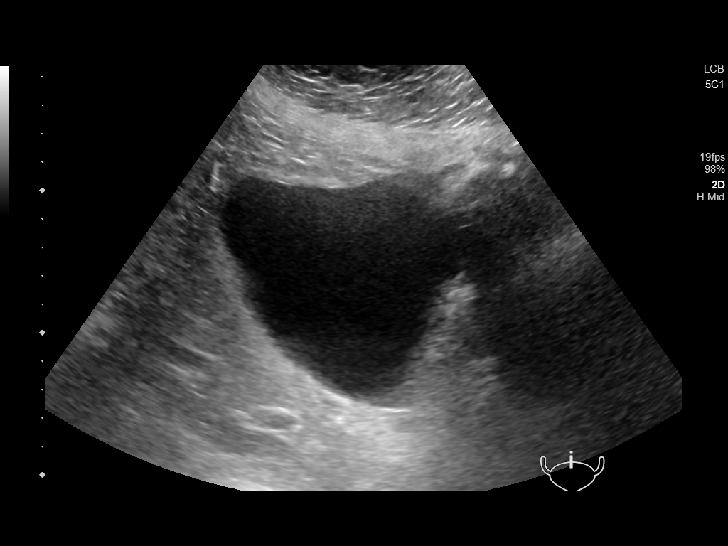
[im 17/38]
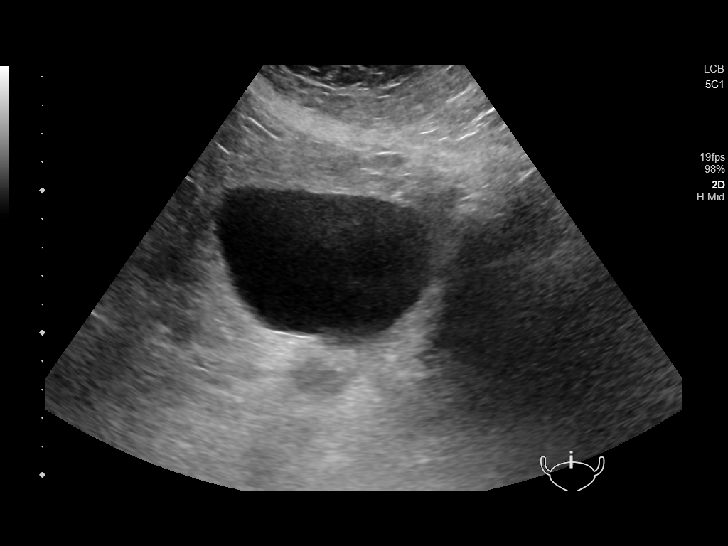
[im 21/38]
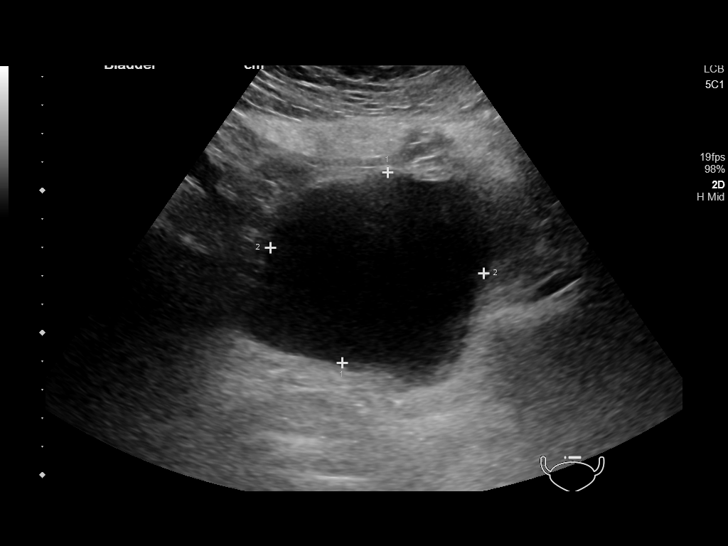
[im 24/38]
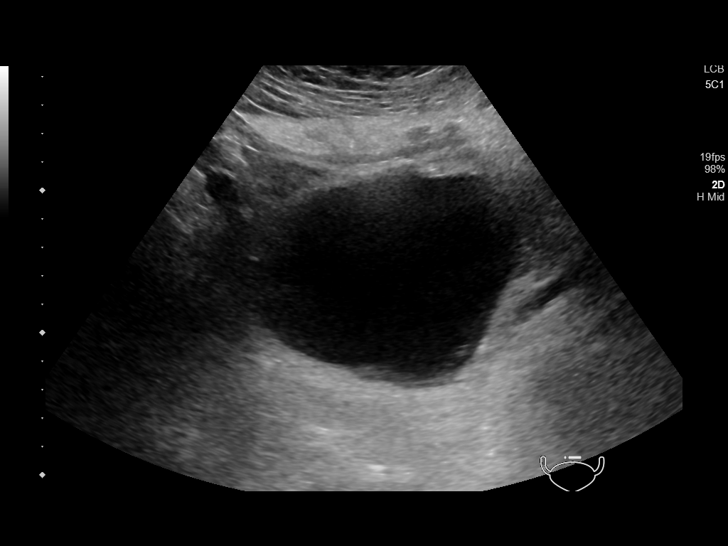
[im 25/38]
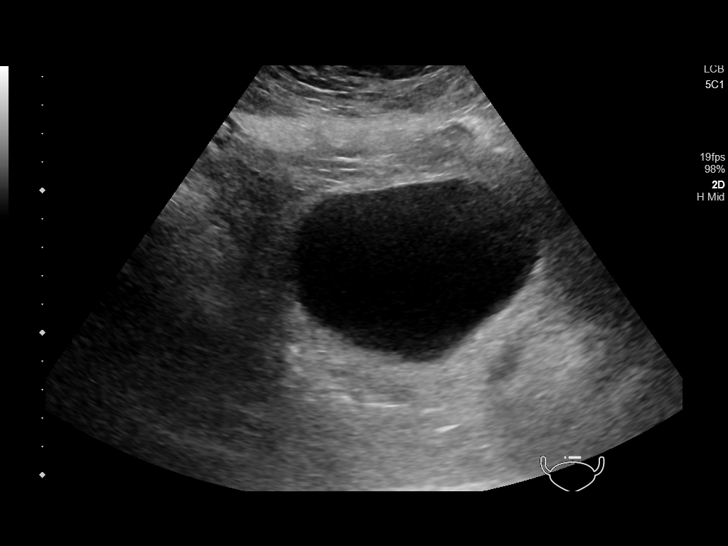
[im 28/38]
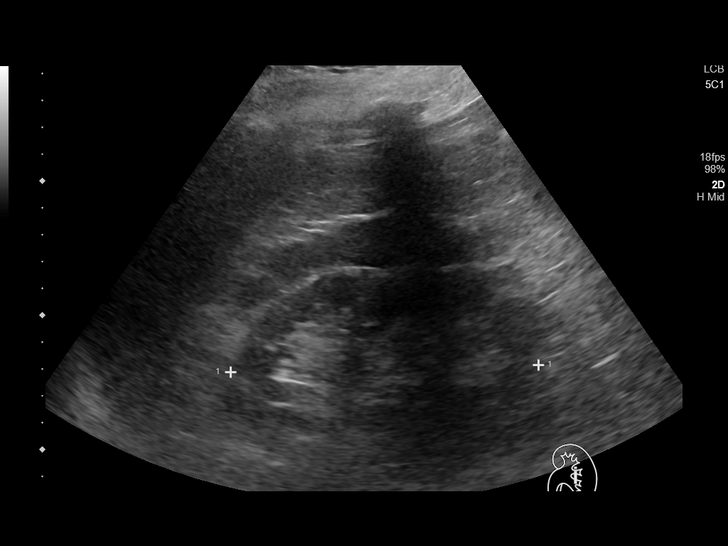
[im 31/38]
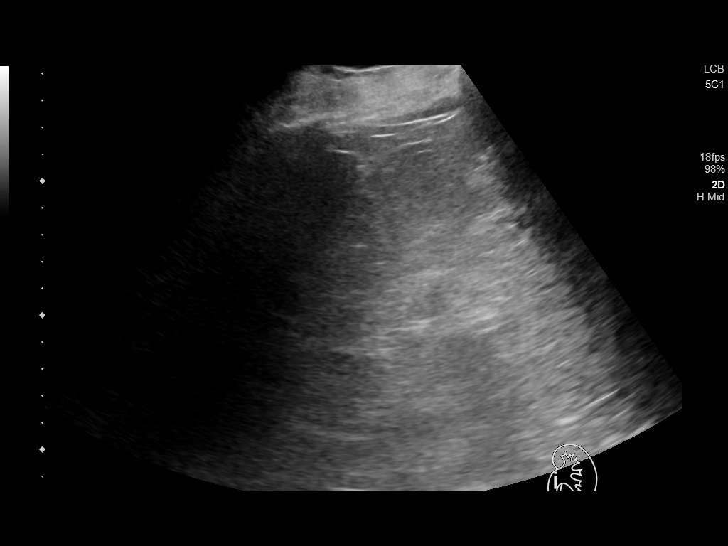
[im 34/38]
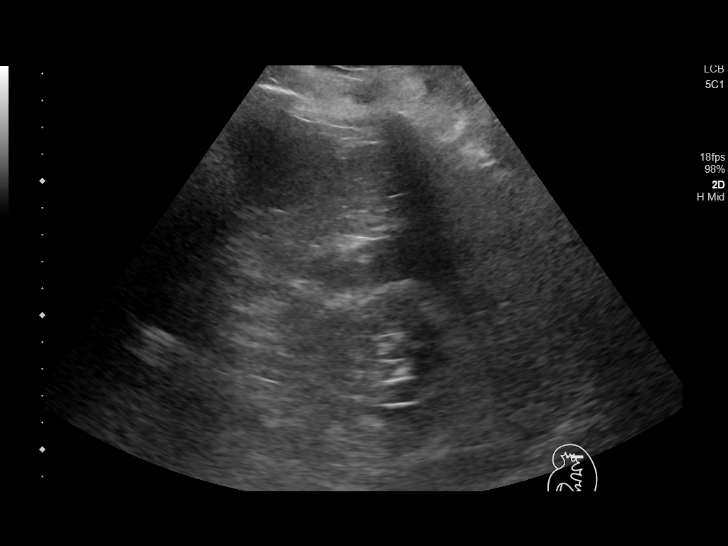
[im 38/38]
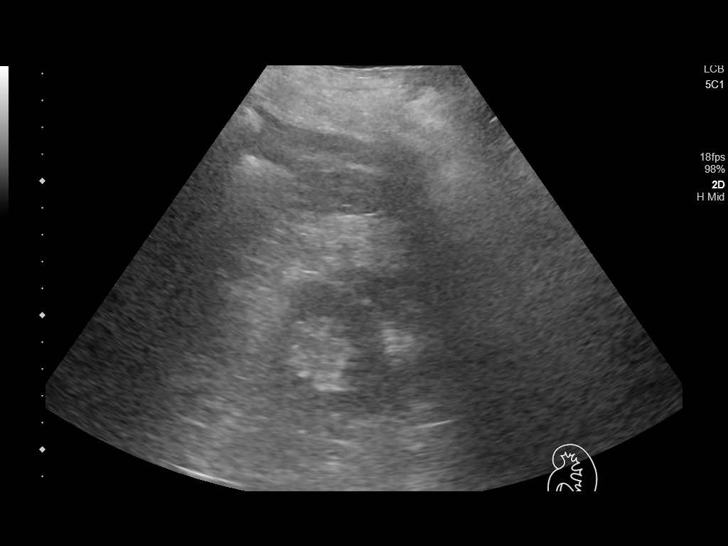

[14 of 25 positions shown; findings below may reference images not displayed]

FINDINGS: Right Kidney:

Renal measurements: 10.4 x 5.2 x 6.4 cm = volume: 184 mL. Mild renal
cortical thinning. Normal renal cortical echogenicity. No
hydronephrosis.

Left Kidney:

Renal measurements: 11.5 x 5.0 x 7.5 cm = volume: 225 mL. Mild renal
cortical thinning. Normal renal cortical echogenicity. No
hydronephrosis.

Bladder:

Appears normal for degree of bladder distention.

Prevoid: 226 cc
IMPRESSION: No hydronephrosis.

Mild renal cortical thinning.
